# Patient Record
Sex: Female | Born: 1965
Health system: Southern US, Community
[De-identification: ages and names within clinical notes are randomized; demographics above are authoritative.]

## PROBLEM LIST (undated history)

## (undated) DIAGNOSIS — L309 Dermatitis, unspecified: Secondary | ICD-10-CM

## (undated) DIAGNOSIS — R51 Headache: Secondary | ICD-10-CM

## (undated) HISTORY — PX: DILATION AND CURETTAGE OF UTERUS: SHX78

## (undated) HISTORY — DX: Dermatitis, unspecified: L30.9

## (undated) HISTORY — PX: OTHER SURGICAL HISTORY: SHX169

## (undated) HISTORY — DX: Headache: R51

---

## 2006-06-21 LAB — CONVERTED CEMR LAB: Pap Smear: NORMAL

## 2009-07-01 ENCOUNTER — Ambulatory Visit: Payer: Self-pay | Admitting: Family Medicine

## 2009-07-01 DIAGNOSIS — E663 Overweight: Secondary | ICD-10-CM | POA: Insufficient documentation

## 2009-07-01 DIAGNOSIS — R519 Headache, unspecified: Secondary | ICD-10-CM | POA: Insufficient documentation

## 2009-07-01 DIAGNOSIS — R5381 Other malaise: Secondary | ICD-10-CM | POA: Insufficient documentation

## 2009-07-01 DIAGNOSIS — R5383 Other fatigue: Secondary | ICD-10-CM

## 2009-07-01 DIAGNOSIS — R51 Headache: Secondary | ICD-10-CM | POA: Insufficient documentation

## 2009-07-02 LAB — CONVERTED CEMR LAB
Direct LDL: 134.9 mg/dL
Eosinophils Relative: 1.3 % (ref 0.0–5.0)
HCT: 39.4 % (ref 36.0–46.0)
HDL: 65.1 mg/dL (ref >39.00)
Hemoglobin: 13.4 g/dL (ref 12.0–15.0)
Lymphs Abs: 2.1 10*3/uL (ref 0.7–4.0)
MCV: 93.7 fL (ref 78.0–100.0)
Monocytes Absolute: 0.6 10*3/uL (ref 0.1–1.0)
Neutro Abs: 5.2 10*3/uL (ref 1.4–7.7)
Platelets: 311 10*3/uL (ref 150.0–400.0)
RDW: 12.7 % (ref 11.5–14.6)
Triglycerides: 44 mg/dL (ref 0.0–149.0)
WBC: 8 10*3/uL (ref 4.5–10.5)

## 2009-07-09 ENCOUNTER — Other Ambulatory Visit: Admission: RE | Admit: 2009-07-09 | Discharge: 2009-07-09 | Payer: Self-pay | Admitting: Family Medicine

## 2009-07-09 ENCOUNTER — Ambulatory Visit: Payer: Self-pay | Admitting: Family Medicine

## 2009-07-09 ENCOUNTER — Encounter: Payer: Self-pay | Admitting: Family Medicine

## 2009-07-15 ENCOUNTER — Encounter (INDEPENDENT_AMBULATORY_CARE_PROVIDER_SITE_OTHER): Payer: Self-pay | Admitting: *Deleted

## 2009-07-23 ENCOUNTER — Encounter: Payer: Self-pay | Admitting: Family Medicine

## 2009-08-02 ENCOUNTER — Encounter: Payer: Self-pay | Admitting: Family Medicine

## 2009-08-04 ENCOUNTER — Encounter: Payer: Self-pay | Admitting: Family Medicine

## 2009-08-04 DIAGNOSIS — N63 Unspecified lump in unspecified breast: Secondary | ICD-10-CM | POA: Insufficient documentation

## 2010-01-14 ENCOUNTER — Encounter: Payer: Self-pay | Admitting: Family Medicine

## 2011-06-09 ENCOUNTER — Other Ambulatory Visit: Payer: Self-pay | Admitting: Family Medicine

## 2011-06-09 DIAGNOSIS — Z131 Encounter for screening for diabetes mellitus: Secondary | ICD-10-CM

## 2011-06-09 DIAGNOSIS — R5381 Other malaise: Secondary | ICD-10-CM

## 2011-06-09 DIAGNOSIS — Z13220 Encounter for screening for lipoid disorders: Secondary | ICD-10-CM

## 2011-06-16 ENCOUNTER — Other Ambulatory Visit (INDEPENDENT_AMBULATORY_CARE_PROVIDER_SITE_OTHER): Payer: 59

## 2011-06-16 DIAGNOSIS — R5383 Other fatigue: Secondary | ICD-10-CM

## 2011-06-16 DIAGNOSIS — Z131 Encounter for screening for diabetes mellitus: Secondary | ICD-10-CM

## 2011-06-16 DIAGNOSIS — R5381 Other malaise: Secondary | ICD-10-CM

## 2011-06-16 DIAGNOSIS — Z13220 Encounter for screening for lipoid disorders: Secondary | ICD-10-CM

## 2011-06-16 LAB — CBC WITH DIFFERENTIAL/PLATELET
Basophils Relative: 0.5 % (ref 0.0–3.0)
Eosinophils Relative: 1.6 % (ref 0.0–5.0)
HCT: 40 % (ref 36.0–46.0)
Hemoglobin: 13.4 g/dL (ref 12.0–15.0)
Lymphs Abs: 2.3 10*3/uL (ref 0.7–4.0)
MCV: 93.7 fl (ref 78.0–100.0)
Monocytes Absolute: 0.6 10*3/uL (ref 0.1–1.0)
Neutro Abs: 4.4 10*3/uL (ref 1.4–7.7)
RBC: 4.27 Mil/uL (ref 3.87–5.11)
WBC: 7.4 10*3/uL (ref 4.5–10.5)

## 2011-06-16 LAB — COMPREHENSIVE METABOLIC PANEL
ALT: 15 U/L (ref 0–35)
AST: 18 U/L (ref 0–37)
Alkaline Phosphatase: 38 U/L — ABNORMAL LOW (ref 39–117)
BUN: 14 mg/dL (ref 6–23)
Creatinine, Ser: 0.7 mg/dL (ref 0.4–1.2)

## 2011-06-16 LAB — LIPID PANEL
Cholesterol: 219 mg/dL — ABNORMAL HIGH (ref 0–200)
HDL: 79 mg/dL (ref >39.00)
Total CHOL/HDL Ratio: 3
VLDL: 10 mg/dL (ref 0.0–40.0)

## 2011-06-21 ENCOUNTER — Encounter: Payer: Self-pay | Admitting: Family Medicine

## 2011-06-21 LAB — HM MAMMOGRAPHY

## 2011-06-22 ENCOUNTER — Ambulatory Visit (INDEPENDENT_AMBULATORY_CARE_PROVIDER_SITE_OTHER): Payer: 59 | Admitting: Family Medicine

## 2011-06-22 ENCOUNTER — Encounter: Payer: Self-pay | Admitting: Family Medicine

## 2011-06-22 ENCOUNTER — Other Ambulatory Visit (HOSPITAL_COMMUNITY)
Admission: RE | Admit: 2011-06-22 | Discharge: 2011-06-22 | Disposition: A | Discharge status: Home / Self Care | Payer: 59 | Source: Ambulatory Visit | Attending: Family Medicine | Admitting: Family Medicine

## 2011-06-22 VITALS — BP 120/80 | HR 55 | Temp 98.3°F | Ht 68.0 in | Wt 181.5 lb

## 2011-06-22 DIAGNOSIS — Z01419 Encounter for gynecological examination (general) (routine) without abnormal findings: Secondary | ICD-10-CM | POA: Insufficient documentation

## 2011-06-22 DIAGNOSIS — Z23 Encounter for immunization: Secondary | ICD-10-CM

## 2011-06-22 DIAGNOSIS — R7309 Other abnormal glucose: Secondary | ICD-10-CM

## 2011-06-22 DIAGNOSIS — Z0000 Encounter for general adult medical examination without abnormal findings: Secondary | ICD-10-CM

## 2011-06-22 DIAGNOSIS — E663 Overweight: Secondary | ICD-10-CM

## 2011-06-22 DIAGNOSIS — Z1159 Encounter for screening for other viral diseases: Secondary | ICD-10-CM | POA: Insufficient documentation

## 2011-06-22 DIAGNOSIS — R739 Hyperglycemia, unspecified: Secondary | ICD-10-CM | POA: Insufficient documentation

## 2011-06-22 MED ORDER — RIZATRIPTAN BENZOATE 5 MG PO TABS: 5.0000 mg | ORAL_TABLET | ORAL | Status: DC | PRN

## 2011-06-22 MED ORDER — PHENTERMINE HCL 30 MG PO TBDP: 1.0000 | ORAL_TABLET | ORAL | Status: DC

## 2011-06-22 MED ORDER — RIZATRIPTAN BENZOATE 10 MG PO TABS: 10.0000 mg | ORAL_TABLET | Freq: Once | ORAL | Status: DC | PRN

## 2011-06-22 NOTE — Progress Notes (Signed)
Addended by: Dianne Dun on: 06/22/2011 03:34 PM   Modules accepted: Orders

## 2011-06-22 NOTE — Progress Notes (Signed)
Subjective:    Patient ID: Kelsey Harrington, female    DOB: 05-12-1966, 45 y.o.   MRN: [id number]  HPI 45 yo here for CPX   1. Borderline Hyperlipidemia.   LDL 140, HDL 79, TG 50. Trying to loose weight, cannot seem to get the weight off. Walking everyday.  Cutting out sugar. Snacks late at night, after work.  2.  Hyperglycemia- fasting CBG 105 today.  Denies any increased thirst or urination. No FH of DM.  Patient Active Problem List  Diagnoses  . OVERWEIGHT  . BREAST MASS, RIGHT  . FATIGUE  . HEADACHE  . Routine general medical examination at a health care facility  . Hyperglycemia   Past Medical History  Diagnosis Date  . Headache    No past surgical history on file. History  Substance Use Topics  . Smoking status: Never Smoker   . Smokeless tobacco: Not on file  . Alcohol Use: Yes   Family History  Problem Relation Age of Onset  . Arthritis Mother   . Hypertension Mother   . Hypertension Maternal Grandmother    No Known Allergies Current Outpatient Prescriptions on File Prior to Visit  Medication Sig Dispense Refill  . b complex vitamins tablet Take 1 tablet by mouth daily.        . Multiple Vitamin (MULTIVITAMIN) tablet Take 1 tablet by mouth daily.           Review of Systems See HPI Patient reports no  vision/ hearing changes,anorexia, weight change, fever ,adenopathy, persistant / recurrent hoarseness, swallowing issues, chest pain, edema,persistant / recurrent cough, hemoptysis, dyspnea(rest, exertional, paroxysmal nocturnal), gastrointestinal  bleeding (melena, rectal bleeding), abdominal pain, excessive heart burn, GU symptoms(dysuria, hematuria, pyuria, voiding/incontinence  Issues) syncope, focal weakness, severe memory loss, concerning skin lesions, depression, anxiety, abnormal bruising/bleeding, major joint swelling, breast masses or abnormal vaginal bleeding.       Objective:   Physical Exam BP 120/80  Pulse 55  Temp(Src) 98.3 F (36.8 C) (Oral)   Ht 5\' 8"  (1.727 m)  Wt 181 lb 8 oz (82.328 kg)  BMI 27.60 kg/m2  LMP 05/29/2011  General:  Well-developed,well-nourished,in no acute distress; alert,appropriate and cooperative throughout examination Head:  normocephalic and atraumatic.   Eyes:  vision grossly intact, pupils equal, pupils round, and pupils reactive to light.   Ears:  R ear normal and L ear normal.   Nose:  no external deformity.   Mouth:  good dentition.   Neck:  No deformities, masses, or tenderness noted. Breasts:  No mass, nodules, thickening, tenderness, bulging, retraction, inflamation, nipple discharge or skin changes noted.   Lungs:  Normal respiratory effort, chest expands symmetrically. Lungs are clear to auscultation, no crackles or wheezes. Heart:  Normal rate and regular rhythm. S1 and S2 normal without gallop, murmur, click, rub or other extra sounds. Abdomen:  Bowel sounds positive,abdomen soft and non-tender without masses, organomegaly or hernias noted. Rectal:  no external abnormalities.   Genitalia:  Pelvic Exam:        External: normal female genitalia without lesions or masses        Vagina: normal without lesions or masses        Cervix: normal without lesions or masses        Adnexa: normal bimanual exam without masses or fullness        Uterus: normal by palpation        Pap smear: performed Msk:  No deformity or scoliosis noted of thoracic or lumbar spine.  Extremities:  No clubbing, cyanosis, edema, or deformity noted with normal full range of motion of all joints.   Neurologic:  alert & oriented X3 and gait normal.   Skin:  Intact without suspicious lesions or rashes Cervical Nodes:  No lymphadenopathy noted Axillary Nodes:  No palpable lymphadenopathy Psych:  Cognition and judgment appear intact. Alert and cooperative with normal attention span and concentration. No apparent delusions, illusions, hallucinations    Assessment & Plan:   1. Routine general medical examination at a health  care facility Reviewed preventive care protocols, scheduled due services, and updated immunizations Discussed nutrition, exercise, diet, and healthy lifestyle.    Cytology -Pap Smear  2. Overweight  Deteriorated.   Discussed risk and benefits of phentermine.  Pt would like to try it. See avs for details.    3. Hyperglycemia   New.  Discussed eat right diet, handout given.

## 2011-06-22 NOTE — Patient Instructions (Signed)
Good to see you. Please follow up with me in one month.  IMPORTANT: HOW TO USE THIS INFORMATION:  This is a summary and does NOT have all possible information about this product. This information does not assure that this product is safe, effective, or appropriate for you. This information is not individual medical advice and does not substitute for the advice of your health care professional. Always ask your health care professional for complete information about this product and your specific health needs.    PHENTERMINE - ORAL (FEN-ter-meen)    COMMON BRAND NAME(S): Adipex-P, Ionamin, Pro-Fast    USES:  Phentermine is used along with a doctor-approved, reduced-calorie diet, exercise, and behavior change program to help you lose weight. It is used in people who are significantly overweight (obese) and have not been able to lose enough weight with diet and exercise alone. Losing weight and keeping it off can reduce the many health risks that come with obesity, including heart disease, diabetes, high blood pressure, and a shorter life. It is not known how this medication helps people to lose weight. It may work by decreasing your appetite, increasing the amount of energy used by your body, or by affecting certain parts of the brain. This medication is an appetite suppressant and belongs to a class of drugs called sympathomimetic amines.    HOW TO USE:  Take this medication by mouth, usually once a day 1 hour before breakfast or 1-2 hours after breakfast or as directed by your doctor. The tablet form may be taken at a lower dose (8 milligrams) up to 3 times a day 30 minutes before meals. Taking this medication late in the day may cause trouble sleeping (insomnia). If you are using sustained-release capsules, swallow the medication whole. Do not crush or chew the sustained-release capsules. Doing so can destroy the long action of the drug and may increase side effects. The dosage is based on your medical  condition and response to therapy. Your doctor will adjust the dose to find the best dose for you. Use this medication regularly and exactly as prescribed in order to get the most benefit from it. To help you remember, take it at the same time(s) each day. This medication is usually taken for only a few weeks at a time. It should not be taken with other appetite suppressants (see also Drug Interactions section). The possibility of serious side effects increases with longer use of this medication and use of this drug along with certain other diet drugs. This medication may cause withdrawal reactions, especially if it has been used regularly for a long time or in high doses. In such cases, withdrawal symptoms (such as depression, severe tiredness) may occur if you suddenly stop using this medication. To prevent withdrawal reactions, your doctor may reduce your dose gradually. Consult your doctor or pharmacist for more details, and report any withdrawal reactions immediately. Rarely, abnormal drug-seeking behavior (addiction) is possible with this medication. Do not increase your dose, take it more frequently, or use it for a longer time than prescribed. Properly stop the medication when so directed. This medication may stop working well after you have been taking it for a few weeks. Talk with your doctor if this medication stops working well. Do not increase the dose unless directed by your doctor. Your doctor may direct you to stop taking this medication.    SIDE EFFECTS:  Dizziness, dry mouth, difficulty sleeping, irritability, nausea, vomiting, diarrhea, or constipation may occur. If these effects  persist or worsen, notify your doctor or pharmacist promptly. Remember that your doctor has prescribed this medication because he or she has judged that the benefit to you is greater than the risk of side effects. Many people using this medication do not have serious side effects. Tell your doctor immediately if any of  these unlikely but serious side effects occur: fast/irregular/pounding heartbeat, mental/mood changes (e.g., agitation, uncontrolled anger, hallucinations, nervousness), uncontrolled muscle movements, change in sexual ability/interest. Stop taking this medication and seek immediate medical attention if any of these rare but very serious side effects occur: severe headache, slurred speech, seizure, weakness on one side of the body, vision changes (e.g., blurred vision). This drug may infrequently cause serious (sometimes fatal) lung or heart problems (pulmonary hypertension, heart valve problems). The risk increases with longer use of this medication and use of this drug along with other appetite-suppressant drugs/herbal products. If you notice any of the following unlikely but very serious side effects, stop taking this medication and consult your doctor or pharmacist immediately: chest pain, difficulty breathing with exercise, decreased ability to exercise, fainting, swelling of the legs/ankles/feet. A very serious allergic reaction to this drug is rare. However, seek immediate medical attention if you notice any of the following symptoms of a serious allergic reaction: rash, itching/swelling (especially of the face/tongue/throat), severe dizziness, trouble breathing. This is not a complete list of possible side effects. If you notice other effects not listed above, contact your doctor or pharmacist. In the [place] - Call your doctor for medical advice about side effects. You may report side effects to FDA at 1-800-FDA-1088. In [place] - Call your doctor for medical advice about side effects. You may report side effects to [facility] [place] at [phone].    PRECAUTIONS:  Before taking this medication, tell your doctor or pharmacist if you are allergic to it; or to any other sympathomimetic amines (e.g., decongestants such as pseudoephedrine, stimulants such as amphetamine, appetite suppressants such as  diethylpropion); or if you have any other allergies. This product may contain inactive ingredients, which can cause allergic reactions or other problems. Talk to your pharmacist for more details. This medication should not be used if you have certain medical conditions. Before using this medicine, consult your doctor or pharmacist if you have: uncontrolled high blood pressure, glaucoma, history of alcohol/drug abuse, vascular heart disease (e.g., chest pain, heart attack), mental/mood problems (e.g., severe anxiety, bipolar disorder, psychosis, schizophrenia), high blood pressure in the lungs (pulmonary hypertension), stroke, overactive thyroid (hyperthyroidism). Before using this medication, tell your doctor or pharmacist your medical history, especially of: diabetes, controlled high blood pressure, other heart problems (e.g., heart murmur, fast/irregular heartbeat, heart valve problems), kidney disease, seizure problem. This drug may make you dizzy or (rarely) drowsy or cause blurred vision. Do not drive, use machinery, or do any activity that requires alertness or clear vision until you are sure you can perform such activities safely. Avoid alcoholic beverages. Before having surgery, tell your doctor or dentist that you are using this medication. Kidney function declines as you grow older. This medication is removed by the kidneys. Therefore, elderly people may be at greater risk for dizziness and high blood pressure while using this drug. During pregnancy, this medication should be used only when clearly needed. It is not recommended for use for long periods or in high doses near the expected delivery date because of possible harm to the unborn baby. Discuss the risks and benefits with your doctor. Infants born to mothers who have  been using this medication for a long time or in high doses may have withdrawal symptoms such as irritability or extreme tiredness. Tell your doctor immediately if you notice any of  these symptoms in your newborn. This drug may pass into breast milk and could have undesirable effects on a nursing infant. Therefore, breast-feeding is not recommended while using this drug. Consult your doctor before breast-feeding.    DRUG INTERACTIONS:  Your doctor or pharmacist may already be aware of any possible drug interactions and may be monitoring you for them. Do not start, stop, or change the dosage of any medicine before checking with them first. This drug should not be used with certain medications because very serious interactions may occur. If you are taking or have taken other appetite-suppressant drugs in the past year (e.g., diethylpropion, sibutramine, ephedra/ma huang), tell your doctor or pharmacist before starting this medication. Avoid taking MAO inhibitors (e.g., furazolidone, isocarboxazid, linezolid, moclobemide, phenelzine, procarbazine, rasagiline, selegiline, tranylcypromine) within 2 weeks before, during, and after treatment with this medication. In some cases a serious (possibly fatal) drug interaction may occur. If you are currently using any of these medications, tell your doctor or pharmacist before starting this medication. Before using this medication, tell your doctor or pharmacist of all prescription and nonprescription/herbal products you may use, especially: drugs for depression (e.g., TCAs such as imipramine, SSRIs and SNRIs such as paroxetine, fluoxetine, venlafaxine, duloxetine), drugs for diabetes (e.g., insulin, sulfonylureas such as glipizide), high blood pressure medicine (e.g., guanethidine, methyldopa), phenothiazines (e.g., prochlorperazine, promethazine, chlorpromazine), other stimulants (e.g., amphetamines, methylphenidate, street drugs such as cocaine or MDMA/"ecstasy"). Tell your doctor or pharmacist if you also take drugs that cause dizziness or drowsiness such as: certain antihistamines (e.g., diphenhydramine), anti-seizure drugs (e.g., carbamazepine),  medicine for sleep or anxiety (e.g., alprazolam, diazepam, zolpidem), muscle relaxants, narcotic pain relievers (e.g., codeine), psychiatric medicines (e.g., risperidone, amitriptyline, trazodone). Also report the use of drugs which might increase seizure risk when combined with this medication such as isoniazid (INH), phenothiazines (e.g., thioridazine), theophylline, or tricyclic antidepressants (e.g., amitriptyline), among others. Check the labels on all your medicines/herbal products (e.g., cough-and-cold products containing decongestants such as pseudoephedrine, diet aids such as phenylpropanolamine, ephedra/ma huang) because they may contain ingredients that could increase your heart rate or blood pressure. Ask your pharmacist about using those products safely. Caffeine can increase the side effects of this medication. Avoid drinking large amounts of beverages containing caffeine (coffee, tea, colas) or eating large amounts of chocolate. This document does not contain all possible interactions. Therefore, before using this product, tell your doctor or pharmacist of all the products you use. Keep a list of all your medications with you, and share the list with your doctor and pharmacist.    OVERDOSE:  If overdose is suspected, contact your poison control center or emergency room immediately. [place] residents can call the [place] National Poison Hotline at 938-205-3921. [place] residents can call a Technical sales engineer center. Symptoms of overdose may include: rapid breathing, unusual restlessness, fast/slow/irregular heartbeat, chest pain, hallucinations, seizures, loss of consciousness.    NOTES:  Appetite suppressants should not be used in place of proper diet. For best results, this drug must be used along with a doctor-approved diet and exercise program. Do not share this medication with others. It is against the law. Laboratory and/or medical tests (e.g., blood pressure, heart tests, kidney tests) may be  performed periodically to monitor your progress or check for side effects. Consult your doctor for more details.  MISSED DOSE:  If you miss a dose, take it as soon as you remember. If it is near the time of the next dose or late in the evening, skip the missed dose and resume your usual dosing schedule. Do not double the dose to catch up.    STORAGE:  Store in a tightly closed container at room temperature between 68-77 degrees F (20-25 degrees C) away from light and moisture. Keep all medications away from children and pets. Do not flush medications down the toilet or pour them into a drain unless instructed to do so. Properly discard this product when it is expired or no longer needed. Consult your pharmacist or local waste disposal company for more details about how to safely discard your product.    Information last revised May 2010 Copyright(c) 2010 [employer].

## 2011-06-23 ENCOUNTER — Telehealth: Payer: Self-pay | Admitting: *Deleted

## 2011-06-23 NOTE — Telephone Encounter (Signed)
Yes definitely ok to change to capsules.

## 2011-06-23 NOTE — Telephone Encounter (Signed)
Called [facility] and left message on voicemail advising ok to change from tablets to capsules.

## 2011-06-23 NOTE — Telephone Encounter (Signed)
Tonya from [facility] called and asked to change phentermine script that was sent in yesterday from disintegrating tablets to capsules.  She says the pt wants the capsules, the other tablets cost 3 times more.  Advised ok to change.

## 2011-06-28 ENCOUNTER — Encounter: Payer: Self-pay | Admitting: *Deleted

## 2011-07-21 ENCOUNTER — Ambulatory Visit (INDEPENDENT_AMBULATORY_CARE_PROVIDER_SITE_OTHER): Payer: 59 | Admitting: Family Medicine

## 2011-07-21 ENCOUNTER — Encounter: Payer: Self-pay | Admitting: Family Medicine

## 2011-07-21 VITALS — BP 120/82 | HR 68 | Temp 98.3°F | Ht 68.0 in | Wt 170.8 lb

## 2011-07-21 DIAGNOSIS — E663 Overweight: Secondary | ICD-10-CM

## 2011-07-21 DIAGNOSIS — M25579 Pain in unspecified ankle and joints of unspecified foot: Secondary | ICD-10-CM

## 2011-07-21 DIAGNOSIS — M25571 Pain in right ankle and joints of right foot: Secondary | ICD-10-CM

## 2011-07-21 MED ORDER — PHENTERMINE HCL 30 MG PO CAPS: 30.0000 mg | ORAL_CAPSULE | ORAL | Status: DC

## 2011-07-21 NOTE — Progress Notes (Signed)
Subjective:    Patient ID: Kelsey Harrington, female    DOB: Apr 04, 1966, 45 y.o.   MRN: [id number]  HPI 45 yo here for one month follow obesity.  Started phentermine last month 45 mg daily last month. Doing very well.  Taking it mid day, it has been helping with her late evening craving. No problems sleeping now, had a little trouble when she first started taking it. No noticeable side effects and BP has been great.  Wt Readings from Last 3 Encounters:  07/21/11 170 lb 12 oz (77.452 kg)  06/22/11 181 lb 8 oz (82.328 kg)  07/09/09 178 lb (80.74 kg)   Right ankle pain- still bothering her. Stepped off a curb in July and inverted her ankle. Has had pain and swelling since.  Swelling has improved but still comes and goes. Still has pain with running and wearing heels.     Patient Active Problem List  Diagnoses  . OVERWEIGHT  . BREAST MASS, RIGHT  . FATIGUE  . HEADACHE  . Routine general medical examination at a health care facility  . Hyperglycemia   Past Medical History  Diagnosis Date  . Headache    No past surgical history on file. History  Substance Use Topics  . Smoking status: Never Smoker   . Smokeless tobacco: Not on file  . Alcohol Use: Yes   Family History  Problem Relation Age of Onset  . Arthritis Mother   . Hypertension Mother   . Hypertension Maternal Grandmother    No Known Allergies Current Outpatient Prescriptions on File Prior to Visit  Medication Sig Dispense Refill  . b complex vitamins tablet Take 1 tablet by mouth daily.        . Multiple Vitamin (MULTIVITAMIN) tablet Take 1 tablet by mouth daily.        . Phentermine HCl 30 MG TBDP Take 1 tablet by mouth 1 day or 1 dose.  30 tablet  0  . rizatriptan (MAXALT) 5 MG tablet Take 1 tablet (5 mg total) by mouth as needed for migraine. May repeat in 2 hours if needed  10 tablet  0     Review of Systems See HPI No CP, SOB, HA or blurred vision.     Objective:   Physical Exam BP 120/82  Pulse 68   Temp(Src) 98.3 F (36.8 C) (Oral)  Ht 5\' 8"  (1.727 m)  Wt 170 lb 12 oz (77.452 kg)  BMI 25.96 kg/m2  LMP 07/19/2011  General:  Well-developed,well-nourished,in no acute distress; alert,appropriate and cooperative throughout examination Head:  normocephalic and atraumatic.   Eyes:  vision grossly intact, pupils equal, pupils round, and pupils reactive to light.   Ears:  R ear normal and L ear normal.   Nose:  no external deformity.   Mouth:  good dentition.   Neck:  No deformities, masses, or tenderness noted. Lungs:  Normal respiratory effort, chest expands symmetrically. Lungs are clear to auscultation, no crackles or wheezes. Heart:  Normal rate and regular rhythm. S1 and S2 normal without gallop, murmur, click, rub or other extra sounds. Abdomen:  Bowel sounds positive,abdomen soft and non-tender without masses, organomegaly or hernias noted. Msk:  No deformity or scoliosis noted of thoracic or lumbar spine.   Extremities:  Right ankle:  + edema,  FROM, tender to active eversion.   Neurologic:  alert & oriented X3 and gait normal.   Skin:  Intact without suspicious lesions or rashes Psych:  Cognition and judgment appear intact. Alert and cooperative  with normal attention span and concentration. No apparent delusions, illusions, hallucinations    Assessment & Plan:   1. Overweight   Improved and normotensive with phentermine. Will refill and follow up in one month.   2. Right ankle pain   Unchanged.  ?Ligamentaous strain or tear at this point. Will refer to ortho for further work up and tx. The patient indicates understanding of these issues and agrees with the plan.  Ambulatory referral to Orthopedic Surgery

## 2011-07-21 NOTE — Patient Instructions (Signed)
Great to see you. Please stop by to see Kelsey Harrington on your way out to set up your orthopedist referral.

## 2011-08-02 ENCOUNTER — Encounter: Payer: Self-pay | Admitting: Family Medicine

## 2011-08-02 ENCOUNTER — Encounter: Payer: Self-pay | Admitting: *Deleted

## 2011-08-18 ENCOUNTER — Encounter: Payer: Self-pay | Admitting: Family Medicine

## 2011-08-18 ENCOUNTER — Ambulatory Visit (INDEPENDENT_AMBULATORY_CARE_PROVIDER_SITE_OTHER): Payer: 59 | Admitting: Family Medicine

## 2011-08-18 VITALS — BP 104/72 | HR 77 | Temp 98.1°F | Ht 68.0 in | Wt 164.5 lb

## 2011-08-18 DIAGNOSIS — E669 Obesity, unspecified: Secondary | ICD-10-CM

## 2011-08-18 MED ORDER — PHENTERMINE HCL 37.5 MG PO CAPS: 30.0000 mg | ORAL_CAPSULE | ORAL | Status: DC

## 2011-08-18 NOTE — Progress Notes (Signed)
  Subjective:    Patient ID: Kelsey Harrington, female    DOB: May 08, 1966, 45 y.o.   MRN: [id number]  HPI 45 yo here for one month follow obesity.  Started phentermine last month 30 mg daily in September. Doing very well.  Taking it mid day, it has been helping with her late evening craving. No problems sleeping now at all. Would like to increase dose if possible. No noticeable side effects and BP has been great.  Wt Readings from Last 3 Encounters:  08/18/11 164 lb 8 oz (74.617 kg)  07/21/11 170 lb 12 oz (77.452 kg)  06/22/11 181 lb 8 oz (82.328 kg)        Patient Active Problem List  Diagnoses  . OVERWEIGHT  . BREAST MASS, RIGHT  . FATIGUE  . HEADACHE  . Routine general medical examination at a health care facility  . Hyperglycemia  . Right ankle pain   Past Medical History  Diagnosis Date  . Headache    No past surgical history on file. History  Substance Use Topics  . Smoking status: Never Smoker   . Smokeless tobacco: Not on file  . Alcohol Use: Yes   Family History  Problem Relation Age of Onset  . Arthritis Mother   . Hypertension Mother   . Hypertension Maternal Grandmother    No Known Allergies Current Outpatient Prescriptions on File Prior to Visit  Medication Sig Dispense Refill  . b complex vitamins tablet Take 1 tablet by mouth daily.        . Multiple Vitamin (MULTIVITAMIN) tablet Take 1 tablet by mouth daily.        . Omega-3 Fatty Acids (FISH OIL) 1000 MG CAPS Take 1 capsule by mouth daily.        . phentermine 30 MG capsule Take 1 capsule (30 mg total) by mouth every morning.  30 capsule  0     Review of Systems See HPI No CP, SOB, HA or blurred vision.     Objective:   Physical Exam LMP 07/19/2011 BP 104/72  Pulse 77  Temp(Src) 98.1 F (36.7 C) (Oral)  Ht 5\' 8"  (1.727 m)  Wt 164 lb 8 oz (74.617 kg)  BMI 25.01 kg/m2  LMP 08/11/2011  General:  Well-developed,well-nourished,in no acute distress; alert,appropriate and cooperative  throughout examination Head:  normocephalic and atraumatic.   Eyes:  vision grossly intact, pupils equal, pupils round, and pupils reactive to light.   Ears:  R ear normal and L ear normal.   Nose:  no external deformity.   Mouth:  good dentition.   Neck:  No deformities, masses, or tenderness noted. Lungs:  Normal respiratory effort, chest expands symmetrically. Lungs are clear to auscultation, no crackles or wheezes. Heart:  Normal rate and regular rhythm. S1 and S2 normal without gallop, murmur, click, rub or other extra sounds. Abdomen:  Bowel sounds positive,abdomen soft and non-tender without masses, organomegaly or hernias noted. Msk:  No deformity or scoliosis noted of thoracic or lumbar spine.   Neurologic:  alert & oriented X3 and gait normal.   Skin:  Intact without suspicious lesions or rashes Psych:  Cognition and judgment appear intact. Alert and cooperative with normal attention span and concentration. No apparent delusions, illusions, hallucinations    Assessment & Plan:   1. Overweight   Improved and normotensive with phentermine. Will increase dose to 37.5 mg d follow up in one month.

## 2011-08-18 NOTE — Patient Instructions (Signed)
Great to see you and congratulations on your weight loss. Please come back to see me in one month.

## 2011-09-28 ENCOUNTER — Encounter: Payer: Self-pay | Admitting: Family Medicine

## 2011-09-28 ENCOUNTER — Ambulatory Visit (INDEPENDENT_AMBULATORY_CARE_PROVIDER_SITE_OTHER): Payer: 59 | Admitting: Family Medicine

## 2011-09-28 VITALS — BP 120/80 | HR 60 | Temp 97.6°F | Ht 68.0 in | Wt 156.8 lb

## 2011-09-28 DIAGNOSIS — E663 Overweight: Secondary | ICD-10-CM

## 2011-09-28 MED ORDER — PHENTERMINE HCL 37.5 MG PO CAPS: 30.0000 mg | ORAL_CAPSULE | ORAL | Status: DC

## 2011-09-28 NOTE — Progress Notes (Signed)
  Subjective:    Patient ID: Kelsey Harrington, female    DOB: 07/22/1966, 45 y.o.   MRN: [id number]  HPI 45 yo here for one month follow obesity.  Started phentermine 30 mg daily in September.  Increased dose to 37.5 mg daily last month.  Doing very well.  Taking it mid day, it has been helping with her late evening craving. No problems sleeping now at all. Would like to increase dose if possible. No noticeable side effects and BP has been great. Her personal goal weight is 148.  Wt Readings from Last 3 Encounters:  09/28/11 156 lb 12 oz (71.101 kg)  08/18/11 164 lb 8 oz (74.617 kg)  07/21/11 170 lb 12 oz (77.452 kg)         Patient Active Problem List  Diagnoses  . OVERWEIGHT  . BREAST MASS, RIGHT  . FATIGUE  . HEADACHE  . Routine general medical examination at a health care facility  . Hyperglycemia  . Right ankle pain   Past Medical History  Diagnosis Date  . Headache    No past surgical history on file. History  Substance Use Topics  . Smoking status: Never Smoker   . Smokeless tobacco: Not on file  . Alcohol Use: Yes   Family History  Problem Relation Age of Onset  . Arthritis Mother   . Hypertension Mother   . Hypertension Maternal Grandmother    No Known Allergies Current Outpatient Prescriptions on File Prior to Visit  Medication Sig Dispense Refill  . b complex vitamins tablet Take 1 tablet by mouth daily.        . Multiple Vitamin (MULTIVITAMIN) tablet Take 1 tablet by mouth daily.        . Omega-3 Fatty Acids (FISH OIL) 1000 MG CAPS Take 1 capsule by mouth daily.           Review of Systems See HPI No CP, SOB, HA or blurred vision.     Objective:   Physical Exam BP 120/80  Pulse 60  Temp(Src) 97.6 F (36.4 C) (Oral)  Ht 5\' 8"  (1.727 m)  Wt 156 lb 12 oz (71.101 kg)  BMI 23.83 kg/m2  LMP 09/08/2011  General:  Well-developed,well-nourished,in no acute distress; alert,appropriate and cooperative throughout examination Head:  normocephalic  and atraumatic.   Eyes:  vision grossly intact, pupils equal, pupils round, and pupils reactive to light.   Ears:  R ear normal and L ear normal.   Nose:  no external deformity.   Mouth:  good dentition.   Neck:  No deformities, masses, or tenderness noted. Lungs:  Normal respiratory effort, chest expands symmetrically. Lungs are clear to auscultation, no crackles or wheezes. Heart:  Normal rate and regular rhythm. S1 and S2 normal without gallop, murmur, click, rub or other extra sounds. Abdomen:  Bowel sounds positive,abdomen soft and non-tender without masses, organomegaly or hernias noted. Msk:  No deformity or scoliosis noted of thoracic or lumbar spine.   Neurologic:  alert & oriented X3 and gait normal.   Skin:  Intact without suspicious lesions or rashes Psych:  Cognition and judgment appear intact. Alert and cooperative with normal attention span and concentration. No apparent delusions, illusions, hallucinations    Assessment & Plan:   1. Overweight   Improved and normotensive with phentermine. BMI 23.8. Can continue phentermine for one more month. Rx given. The patient indicates understanding of these issues and agrees with the plan.

## 2012-07-25 ENCOUNTER — Encounter: Payer: Self-pay | Admitting: Family Medicine

## 2012-07-25 ENCOUNTER — Ambulatory Visit (INDEPENDENT_AMBULATORY_CARE_PROVIDER_SITE_OTHER): Payer: BC Managed Care – PPO | Admitting: Family Medicine

## 2012-07-25 VITALS — BP 118/82 | HR 68 | Temp 97.5°F | Ht 67.25 in | Wt 171.0 lb

## 2012-07-25 DIAGNOSIS — Z136 Encounter for screening for cardiovascular disorders: Secondary | ICD-10-CM

## 2012-07-25 DIAGNOSIS — R739 Hyperglycemia, unspecified: Secondary | ICD-10-CM

## 2012-07-25 DIAGNOSIS — R7309 Other abnormal glucose: Secondary | ICD-10-CM

## 2012-07-25 DIAGNOSIS — E663 Overweight: Secondary | ICD-10-CM

## 2012-07-25 DIAGNOSIS — Z0000 Encounter for general adult medical examination without abnormal findings: Secondary | ICD-10-CM

## 2012-07-25 DIAGNOSIS — Z1231 Encounter for screening mammogram for malignant neoplasm of breast: Secondary | ICD-10-CM

## 2012-07-25 DIAGNOSIS — Z23 Encounter for immunization: Secondary | ICD-10-CM

## 2012-07-25 LAB — COMPREHENSIVE METABOLIC PANEL
AST: 18 U/L (ref 0–37)
Albumin: 3.9 g/dL (ref 3.5–5.2)
BUN: 12 mg/dL (ref 6–23)
CO2: 30 mEq/L (ref 19–32)
Calcium: 9.9 mg/dL (ref 8.4–10.5)
Chloride: 107 mEq/L (ref 96–112)
GFR: 85.65 mL/min (ref >60.00)
Glucose, Bld: 94 mg/dL (ref 70–99)
Potassium: 4.5 mEq/L (ref 3.5–5.1)

## 2012-07-25 LAB — CBC WITH DIFFERENTIAL/PLATELET
Basophils Absolute: 0 10*3/uL (ref 0.0–0.1)
Eosinophils Relative: 1.7 % (ref 0.0–5.0)
HCT: 38 % (ref 36.0–46.0)
Hemoglobin: 12.8 g/dL (ref 12.0–15.0)
Lymphs Abs: 2.1 10*3/uL (ref 0.7–4.0)
MCV: 93.1 fl (ref 78.0–100.0)
Monocytes Relative: 8.9 % (ref 3.0–12.0)
Neutro Abs: 3.7 10*3/uL (ref 1.4–7.7)
RDW: 13.2 % (ref 11.5–14.6)

## 2012-07-25 LAB — LIPID PANEL
Cholesterol: 190 mg/dL (ref 0–200)
Total CHOL/HDL Ratio: 3
Triglycerides: 40 mg/dL (ref 0.0–149.0)

## 2012-07-25 MED ORDER — PHENTERMINE HCL 30 MG PO CAPS: 30.0000 mg | ORAL_CAPSULE | ORAL | Status: DC

## 2012-07-25 NOTE — Patient Instructions (Addendum)
Good to see you. We will call you with your lab results. Please stop by to see Shirlee Limerick on your way out to set up your mammogram.  Please come see me in one month to follow up phentermine.

## 2012-07-25 NOTE — Progress Notes (Signed)
Subjective:    Patient ID: Kelsey Harrington, female    DOB: 1966/06/20, 46 y.o.   MRN: [id number]  HPI 46 yo here for CPX.  Doing well.  Just got a promotion at work that she has been working towards for four years!  Last pap smear was done here in 06/2011- normal.  Denies any dysuria, vaginal discharge or other GU complaints.   1. Borderline Hyperlipidemia.   Lab Results  Component Value Date   CHOL 219* 06/16/2011   HDL 79.00 06/16/2011   LDLCALC 136 07/01/2009   LDLDIRECT 140.0 06/16/2011   TRIG 50.0 06/16/2011   CHOLHDL 3 06/16/2011     2.  Hyperglycemia-h/o hyperglycemia last year.  Due for labs this year.  3.  Weight gain- did really well with phentermine last year.  She is a stress eater and has been snacking more lately. Never had any CP, SOB or palpitations while she was taking phentermine. She is interested in restarting it.  Patient Active Problem List  Diagnosis  . OVERWEIGHT  . BREAST MASS, RIGHT  . FATIGUE  . HEADACHE  . Routine general medical examination at a health care facility  . Hyperglycemia  . Right ankle pain   Past Medical History  Diagnosis Date  . Headache    No past surgical history on file. History  Substance Use Topics  . Smoking status: Never Smoker   . Smokeless tobacco: Not on file  . Alcohol Use: Yes   Family History  Problem Relation Age of Onset  . Arthritis Mother   . Hypertension Mother   . Hypertension Maternal Grandmother    No Known Allergies Current Outpatient Prescriptions on File Prior to Visit  Medication Sig Dispense Refill  . b complex vitamins tablet Take 1 tablet by mouth daily.        . Multiple Vitamin (MULTIVITAMIN) tablet Take 1 tablet by mouth daily.        . Omega-3 Fatty Acids (FISH OIL) 1000 MG CAPS Take 1 capsule by mouth daily.           Review of Systems See HPI Patient reports no  vision/ hearing changes,anorexia, weight change, fever ,adenopathy, persistant / recurrent hoarseness, swallowing issues, chest  pain, edema,persistant / recurrent cough, hemoptysis, dyspnea(rest, exertional, paroxysmal nocturnal), gastrointestinal  bleeding (melena, rectal bleeding), abdominal pain, excessive heart burn, GU symptoms(dysuria, hematuria, pyuria, voiding/incontinence  Issues) syncope, focal weakness, severe memory loss, concerning skin lesions, depression, anxiety, abnormal bruising/bleeding, major joint swelling, breast masses or abnormal vaginal bleeding.       Objective:   Physical Exam BP 118/82  Pulse 68  Temp 97.5 F (36.4 C)  Ht 5' 7.25" (1.708 m)  Wt 171 lb (77.565 kg)  BMI 26.58 kg/m2 Wt Readings from Last 3 Encounters:  07/25/12 171 lb (77.565 kg)  09/28/11 156 lb 12 oz (71.101 kg)  08/18/11 164 lb 8 oz (74.617 kg)    General:  Well-developed,well-nourished,in no acute distress; alert,appropriate and cooperative throughout examination Head:  normocephalic and atraumatic.   Eyes:  vision grossly intact, pupils equal, pupils round, and pupils reactive to light.   Ears:  R ear normal and L ear normal.   Nose:  no external deformity.   Mouth:  good dentition.   Neck:  No deformities, masses, or tenderness noted. Breasts:  No mass, nodules, thickening, tenderness, bulging, retraction, inflamation, nipple discharge or skin changes noted.   Lungs:  Normal respiratory effort, chest expands symmetrically. Lungs are clear to auscultation, no crackles or  wheezes. Heart:  Normal rate and regular rhythm. S1 and S2 normal without gallop, murmur, click, rub or other extra sounds. Msk:  No deformity or scoliosis noted of thoracic or lumbar spine.   Extremities:  No clubbing, cyanosis, edema, or deformity noted with normal full range of motion of all joints.   Neurologic:  alert & oriented X3 and gait normal.   Skin:  Intact without suspicious lesions or rashes Psych:  Cognition and judgment appear intact. Alert and cooperative with normal attention span and concentration. No apparent delusions,  illusions, hallucinations    Assessment & Plan:   1. Routine general medical examination at a health care facility  Reviewed preventive care protocols, scheduled due services, and updated immunizations Discussed nutrition, exercise, diet, and healthy lifestyle.  Comprehensive metabolic panel, CBC with Differential  2. Hyperglycemia  Recheck fasting CBG today.   3. Screening for ischemic heart disease  Lipid Panel  4. Other screening mammogram  MM Digital Screening  5. Overweight  Deteriorated. Tolerated phentermine well.  Discussed risks again of phentermine.  Pt aware and would like to try it again- rx given for 30 mg capsules. She will follow up in one month. The patient indicates understanding of these issues and agrees with the plan.

## 2012-07-25 NOTE — Addendum Note (Signed)
Addended by: Eliezer Bottom on: 07/25/2012 01:01 PM   Modules accepted: Orders

## 2012-07-29 ENCOUNTER — Encounter: Payer: Self-pay | Admitting: *Deleted

## 2012-08-01 ENCOUNTER — Encounter: Payer: Self-pay | Admitting: Family Medicine

## 2012-08-01 ENCOUNTER — Encounter: Payer: Self-pay | Admitting: *Deleted

## 2012-12-19 ENCOUNTER — Ambulatory Visit (INDEPENDENT_AMBULATORY_CARE_PROVIDER_SITE_OTHER): Payer: 59 | Admitting: Family Medicine

## 2012-12-19 ENCOUNTER — Encounter: Payer: Self-pay | Admitting: Family Medicine

## 2012-12-19 VITALS — BP 120/80 | HR 71 | Temp 98.1°F | Wt 179.0 lb

## 2012-12-19 DIAGNOSIS — H1013 Acute atopic conjunctivitis, bilateral: Secondary | ICD-10-CM

## 2012-12-19 DIAGNOSIS — H1045 Other chronic allergic conjunctivitis: Secondary | ICD-10-CM

## 2012-12-19 MED ORDER — BEPOTASTINE BESILATE 1.5 % OP SOLN: 1.0000 [drp] | Freq: Two times a day (BID) | OPHTHALMIC | Status: DC

## 2012-12-19 NOTE — Patient Instructions (Addendum)

## 2012-12-19 NOTE — Progress Notes (Signed)
SUBJECTIVE:  47 y.o. female with burning, redness, discharge and mattering in both eyes for 3 days. This has happened on and off for past 6 weeks.  Also having itchy rash that comes and goes on her neck.  Does not have it now but had it this morning.   .  No significant prior ophthalmological history. No change in visual acuity, no photophobia, no severe eye pain.  Patient Active Problem List  Diagnosis  . OVERWEIGHT  . BREAST MASS, RIGHT  . FATIGUE  . HEADACHE  . Routine general medical examination at a health care facility  . Hyperglycemia  . Right ankle pain   Past Medical History  Diagnosis Date  . Headache    No past surgical history on file. History  Substance Use Topics  . Smoking status: Never Smoker   . Smokeless tobacco: Never Used  . Alcohol Use: Yes   Family History  Problem Relation Age of Onset  . Arthritis Mother   . Hypertension Mother   . Hypertension Maternal Grandmother    No Known Allergies Current Outpatient Prescriptions on File Prior to Visit  Medication Sig Dispense Refill  . Omega-3 Fatty Acids (FISH OIL) 1000 MG CAPS Take 1 capsule by mouth daily.        . phentermine 30 MG capsule Take 1 capsule (30 mg total) by mouth every morning.  30 capsule  0   No current facility-administered medications on file prior to visit.   The PMH, PSH, Social History, Family History, Medications, and allergies have been reviewed in [facility], and have been updated if relevant.  OBJECTIVE:  BP 120/80  Pulse 71  Temp(Src) 98.1 F (36.7 C) (Oral)  Wt 179 lb (81.194 kg)  BMI 27.83 kg/m2  SpO2 99%  Patient appears well, vitals signs are normal. Eyes: both eyes with erythema, no discharge.  PERRLA, no foreign body noted. No periorbital cellulitis. The corneas are clear and fundi normal. Visual acuity normal.   ASSESSMENT:  Conjunctivitis - probably allergic.  PLAN:  Bepreve drops per order. Add Zyrtec po daily.   She has thrown away her current eye makeup.  Did  change body wash- she has stopped this.  If no improvement by Monday, she will follow up with her eye doctor.   The patient indicates understanding of these issues and agrees with the plan.

## 2013-01-21 ENCOUNTER — Encounter: Payer: Self-pay | Admitting: Family Medicine

## 2013-01-21 ENCOUNTER — Ambulatory Visit (INDEPENDENT_AMBULATORY_CARE_PROVIDER_SITE_OTHER): Payer: 59 | Admitting: Family Medicine

## 2013-01-21 VITALS — BP 140/90 | HR 76 | Temp 98.0°F | Wt 178.0 lb

## 2013-01-21 DIAGNOSIS — E663 Overweight: Secondary | ICD-10-CM

## 2013-01-21 MED ORDER — PHENTERMINE HCL 30 MG PO CAPS: 30.0000 mg | ORAL_CAPSULE | ORAL | Status: DC

## 2013-01-21 NOTE — Patient Instructions (Addendum)
Good to see you. We are restarting the phentermine 30 mg daily. Follow up in one month.

## 2013-01-21 NOTE — Progress Notes (Signed)
  Subjective:    Patient ID: Kelsey Harrington, female    DOB: [birth year], 47 y.o.   MRN: [id number]  HPI 47 yo here for one month follow obesity.  In 2012, she did have some success with Phentermine.    Had no elevated BP, palpitations or other noticeable side effects. She also had more energy and motivation to exercise.  She would like to restart it. Wt Readings from Last 3 Encounters:  01/21/13 178 lb (80.74 kg)  12/19/12 179 lb (81.194 kg)  07/25/12 171 lb (77.565 kg)   BMI now 27.6.      Patient Active Problem List  Diagnosis  . OVERWEIGHT  . BREAST MASS, RIGHT  . FATIGUE  . HEADACHE  . Hyperglycemia   Past Medical History  Diagnosis Date  . Headache    No past surgical history on file. History  Substance Use Topics  . Smoking status: Never Smoker   . Smokeless tobacco: Never Used  . Alcohol Use: Yes   Family History  Problem Relation Age of Onset  . Arthritis Mother   . Hypertension Mother   . Hypertension Maternal Grandmother    No Known Allergies Current Outpatient Prescriptions on File Prior to Visit  Medication Sig Dispense Refill  . Bepotastine Besilate 1.5 % SOLN Place 1 drop into both eyes 2 (two) times daily.  1 Bottle  0  . Omega-3 Fatty Acids (FISH OIL) 1000 MG CAPS Take 1 capsule by mouth daily.        . phentermine 30 MG capsule Take 1 capsule (30 mg total) by mouth every morning.  30 capsule  0   No current facility-administered medications on file prior to visit.     Review of Systems See HPI No CP, SOB, HA or blurred vision.     Objective:   Physical Exam Wt Readings from Last 3 Encounters:  01/21/13 178 lb (80.74 kg)  12/19/12 179 lb (81.194 kg)  07/25/12 171 lb (77.565 kg)   BP Readings from Last 3 Encounters:  01/21/13 140/90  12/19/12 120/80  07/25/12 118/82   General:  Well-developed,well-nourished,in no acute distress; alert,appropriate and cooperative throughout examination Head:  normocephalic and atraumatic.   Eyes:   vision grossly intact, pupils equal, pupils round, and pupils reactive to light.   Ears:  R ear normal and L ear normal.   Nose:  no external deformity.   Mouth:  good dentition.   Neck:  No deformities, masses, or tenderness noted. Lungs:  Normal respiratory effort, chest expands symmetrically. Lungs are clear to auscultation, no crackles or wheezes. Heart:  Normal rate and regular rhythm. S1 and S2 normal without gallop, murmur, click, rub or other extra sounds. Abdomen:  Bowel sounds positive,abdomen soft and non-tender without masses, organomegaly or hernias noted. Msk:  No deformity or scoliosis noted of thoracic or lumbar spine.   Neurologic:  alert & oriented X3 and gait normal.   Skin:  Intact without suspicious lesions or rashes Psych:  Cognition and judgment appear intact. Alert and cooperative with normal attention span and concentration. No apparent delusions, illusions, hallucinations    Assessment & Plan:   1. Overweight Deteriorated.   She is aware of risks of phentermine and would like to restart it. Rx given for phentermine 30 mg daily. Follow up in one month.

## 2013-02-27 ENCOUNTER — Ambulatory Visit (INDEPENDENT_AMBULATORY_CARE_PROVIDER_SITE_OTHER): Payer: 59 | Admitting: Family Medicine

## 2013-02-27 ENCOUNTER — Encounter: Payer: Self-pay | Admitting: Family Medicine

## 2013-02-27 VITALS — BP 128/80 | HR 76 | Temp 98.3°F | Wt 169.0 lb

## 2013-02-27 DIAGNOSIS — E663 Overweight: Secondary | ICD-10-CM

## 2013-02-27 MED ORDER — PHENTERMINE HCL 30 MG PO CAPS: 30.0000 mg | ORAL_CAPSULE | ORAL | Status: DC

## 2013-02-27 NOTE — Progress Notes (Signed)
  Subjective:    Patient ID: Kelsey Harrington, female    DOB: [birth year], 47 y.o.   MRN: [id number]  HPI 47 yo here for one month follow obesity.  Restarted phentermine last month.    Has had no elevated BP, palpitations or other noticeable side effects. She also had more energy and motivation to exercise.  Wt Readings from Last 3 Encounters:  02/27/13 169 lb (76.658 kg)  01/21/13 178 lb (80.74 kg)  12/19/12 179 lb (81.194 kg)     Patient Active Problem List   Diagnosis Date Noted  . Hyperglycemia 06/22/2011  . BREAST MASS, RIGHT 08/04/2009  . OVERWEIGHT 07/01/2009  . FATIGUE 07/01/2009  . HEADACHE 07/01/2009   Past Medical History  Diagnosis Date  . Headache    No past surgical history on file. History  Substance Use Topics  . Smoking status: Never Smoker   . Smokeless tobacco: Never Used  . Alcohol Use: Yes   Family History  Problem Relation Age of Onset  . Arthritis Mother   . Hypertension Mother   . Hypertension Maternal Grandmother    No Known Allergies Current Outpatient Prescriptions on File Prior to Visit  Medication Sig Dispense Refill  . Omega-3 Fatty Acids (FISH OIL) 1000 MG CAPS Take 1 capsule by mouth daily.        . phentermine 30 MG capsule Take 1 capsule (30 mg total) by mouth every morning.  30 capsule  0   No current facility-administered medications on file prior to visit.     Review of Systems See HPI No CP, SOB, HA or blurred vision.     Objective:   Physical Exam BP 128/80  Pulse 76  Temp(Src) 98.3 F (36.8 C)  Wt 169 lb (76.658 kg)  BMI 26.28 kg/m2  Wt Readings from Last 3 Encounters:  02/27/13 169 lb (76.658 kg)  01/21/13 178 lb (80.74 kg)  12/19/12 179 lb (81.194 kg)   BP Readings from Last 3 Encounters:  02/27/13 128/80  01/21/13 140/90  12/19/12 120/80   General:  Well-developed,well-nourished,in no acute distress; alert,appropriate and cooperative throughout examination Head:  normocephalic and atraumatic.   Eyes:   vision grossly intact, pupils equal, pupils round, and pupils reactive to light.   Ears:  R ear normal and L ear normal.   Nose:  no external deformity.   Mouth:  good dentition.   Neck:  No deformities, masses, or tenderness noted. Lungs:  Normal respiratory effort, chest expands symmetrically. Lungs are clear to auscultation, no crackles or wheezes. Heart:  Normal rate and regular rhythm. S1 and S2 normal without gallop, murmur, click, rub or other extra sounds. Abdomen:  Bowel sounds positive,abdomen soft and non-tender without masses, organomegaly or hernias noted. Msk:  No deformity or scoliosis noted of thoracic or lumbar spine.   Neurologic:  alert & oriented X3 and gait normal.   Skin:  Intact without suspicious lesions or rashes Psych:  Cognition and judgment appear intact. Alert and cooperative with normal attention span and concentration. No apparent delusions, illusions, hallucinations    Assessment & Plan:   1. Overweight Improved with 10 pound weight loss! Continue phentermine at current dose. Follow up in one month.

## 2013-02-27 NOTE — Patient Instructions (Addendum)
Good to see you. We are continuing the phentermine 30 mg daily. Follow up in one month.

## 2013-04-14 ENCOUNTER — Ambulatory Visit (INDEPENDENT_AMBULATORY_CARE_PROVIDER_SITE_OTHER): Payer: 59 | Admitting: Family Medicine

## 2013-04-14 ENCOUNTER — Encounter: Payer: Self-pay | Admitting: Family Medicine

## 2013-04-14 VITALS — BP 130/80 | HR 64 | Temp 98.0°F | Wt 170.0 lb

## 2013-04-14 DIAGNOSIS — E663 Overweight: Secondary | ICD-10-CM

## 2013-04-14 MED ORDER — PHENTERMINE HCL 37.5 MG PO CAPS: 37.5000 mg | ORAL_CAPSULE | ORAL | Status: DC

## 2013-04-14 NOTE — Progress Notes (Signed)
  Subjective:    Patient ID: Kelsey Harrington, female    DOB: [birth year], 47 y.o.   MRN: [id number]  HPI 47 yo here for one month follow obesity.  Restarted phentermine two months ago.   Has had no elevated BP, palpitations or other noticeable side effects. She also had more energy and motivation to exercise.  Weight stable- has lost 10 pounds since restarting it.  Wt Readings from Last 3 Encounters:  04/14/13 170 lb (77.111 kg)  02/27/13 169 lb (76.658 kg)  01/21/13 178 lb (80.74 kg)     Patient Active Problem List   Diagnosis Date Noted  . Hyperglycemia 06/22/2011  . BREAST MASS, RIGHT 08/04/2009  . OVERWEIGHT 07/01/2009  . FATIGUE 07/01/2009  . Kelsey Harrington 07/01/2009   Past Medical History  Diagnosis Date  . Kelsey Harrington(784.0)    No past surgical history on file. History  Substance Use Topics  . Smoking status: Never Smoker   . Smokeless tobacco: Never Used  . Alcohol Use: Yes   Family History  Problem Relation Age of Onset  . Arthritis Kelsey Harrington   . Kelsey Harrington   . Kelsey Harrington    No Known Allergies Current Outpatient Prescriptions on File Prior to Visit  Medication Sig Dispense Refill  . Multiple Vitamin (MULTIVITAMIN) tablet Take 1 tablet by mouth daily.      . Omega-3 Fatty Acids (FISH OIL) 1000 MG CAPS Take 1 capsule by mouth daily.        . phentermine 30 MG capsule Take 1 capsule (30 mg total) by mouth every morning.  30 capsule  0   No current facility-administered medications on file prior to visit.     Review of Systems See HPI No CP, SOB, HA or blurred vision.     Objective:   Physical Exam BP 130/80  Pulse 64  Temp(Src) 98 F (36.7 C)  Wt 170 lb (77.111 kg)  BMI 26.43 kg/m2   Wt Readings from Last 3 Encounters:  04/14/13 170 lb (77.111 kg)  02/27/13 169 lb (76.658 kg)  01/21/13 178 lb (80.74 kg)    General:  Well-developed,well-nourished,in no acute distress; alert,appropriate and cooperative throughout  examination Head:  normocephalic and atraumatic.   Eyes:  vision grossly intact, pupils equal, pupils round, and pupils reactive to light.   Ears:  R ear normal and L ear normal.   Nose:  no external deformity.   Mouth:  good dentition.   Neck:  No deformities, masses, or tenderness noted. Lungs:  Normal respiratory effort, chest expands symmetrically. Lungs are clear to auscultation, no crackles or wheezes. Heart:  Normal rate and regular rhythm. S1 and S2 normal without gallop, murmur, click, rub or other extra sounds. Abdomen:  Bowel sounds positive,abdomen soft and non-tender without masses, organomegaly or hernias noted. Msk:  No deformity or scoliosis noted of thoracic or lumbar spine.   Neurologic:  alert & oriented X3 and gait normal.   Skin:  Intact without suspicious lesions or rashes Psych:  Cognition and judgment appear intact. Alert and cooperative with normal attention span and concentration. No apparent delusions, illusions, hallucinations    Assessment & Plan:   1. Overweight   Weight is stable.  She feels good.  VSS and asymptomatic Increase phentermine to 37.5 mg daily. Follow up in 2 months.

## 2013-04-14 NOTE — Patient Instructions (Addendum)
Good to see you. We are increasing phentermine 37.5 mg daily. Follow up in two months.

## 2013-05-15 ENCOUNTER — Other Ambulatory Visit: Payer: Self-pay

## 2013-05-15 NOTE — Telephone Encounter (Signed)
Pt request refill phentermine to [facility]. Pt said Dr Dayton Martes said when need refill call and she would refill.Please advise.

## 2013-05-16 MED ORDER — PHENTERMINE HCL 37.5 MG PO CAPS: 37.5000 mg | ORAL_CAPSULE | ORAL | Status: DC

## 2013-05-16 NOTE — Telephone Encounter (Signed)
plz phone in. 

## 2013-05-16 NOTE — Telephone Encounter (Signed)
[facility] to [facility]. 

## 2013-07-22 ENCOUNTER — Encounter: Payer: 59 | Admitting: Family Medicine

## 2013-07-29 ENCOUNTER — Encounter: Payer: 59 | Admitting: Family Medicine

## 2013-10-27 ENCOUNTER — Encounter: Payer: 59 | Admitting: Family Medicine

## 2013-10-28 ENCOUNTER — Ambulatory Visit (INDEPENDENT_AMBULATORY_CARE_PROVIDER_SITE_OTHER): Payer: 59 | Admitting: Family Medicine

## 2013-10-28 ENCOUNTER — Encounter: Payer: Self-pay | Admitting: Family Medicine

## 2013-10-28 VITALS — BP 106/62 | HR 61 | Temp 97.9°F | Ht 66.5 in | Wt 177.5 lb

## 2013-10-28 DIAGNOSIS — Z0000 Encounter for general adult medical examination without abnormal findings: Secondary | ICD-10-CM

## 2013-10-28 DIAGNOSIS — E663 Overweight: Secondary | ICD-10-CM

## 2013-10-28 DIAGNOSIS — Z136 Encounter for screening for cardiovascular disorders: Secondary | ICD-10-CM

## 2013-10-28 DIAGNOSIS — Z1231 Encounter for screening mammogram for malignant neoplasm of breast: Secondary | ICD-10-CM

## 2013-10-28 MED ORDER — PHENTERMINE HCL 30 MG PO CAPS: 30.0000 mg | ORAL_CAPSULE | ORAL | Status: DC

## 2013-10-28 NOTE — Progress Notes (Signed)
Pre-visit discussion using our clinic review tool. No additional management support is needed unless otherwise documented below in the visit note.  

## 2013-10-28 NOTE — Progress Notes (Signed)
Subjective:    Patient ID: Kelsey Harrington, female    DOB: [birth year], 48 y.o.   MRN: [id number]  HPI 48 yo here for CPX.  Doing well.  Just got a promotion at work that she has been working towards for four years!  Last pap smear was done here in 06/2011- normal.  Denies any dysuria, vaginal discharge or other GU complaints.  On her period today and would like to come back for her pap smear.  Due for mammogram.   1. Borderline Hyperlipidemia.  Due for labs. Lab Results  Component Value Date   CHOL 190 07/25/2012   HDL 67.70 07/25/2012   LDLCALC 114* 07/25/2012   LDLDIRECT 140.0 06/16/2011   TRIG 40.0 07/25/2012   CHOLHDL 3 07/25/2012     2.  Hyperglycemia-h/o hyperglycemia.   Due for labs this year.  3.  Weight gain- did really well with phentermine last year.  She is a stress eater and has been snacking more lately with her new job promotion. Never had any CP, SOB or palpitations while she was taking phentermine. She is interested in restarting it.  Patient Active Problem List   Diagnosis Date Noted  . Routine general medical examination at a health care facility 10/28/2013  . Hyperglycemia 06/22/2011  . BREAST MASS, RIGHT 08/04/2009  . OVERWEIGHT 07/01/2009  . FATIGUE 07/01/2009  . HEADACHE 07/01/2009   Past Medical History  Diagnosis Date  . Headache(784.0)    No past surgical history on file. History  Substance Use Topics  . Smoking status: Never Smoker   . Smokeless tobacco: Never Used  . Alcohol Use: Yes   Family History  Problem Relation Age of Onset  . Arthritis Mother   . Hypertension Mother   . Hypertension Maternal Grandmother    No Known Allergies Current Outpatient Prescriptions on File Prior to Visit  Medication Sig Dispense Refill  . Multiple Vitamin (MULTIVITAMIN) tablet Take 1 tablet by mouth daily.      . Omega-3 Fatty Acids (FISH OIL) 1000 MG CAPS Take 1 capsule by mouth daily.        . phentermine 37.5 MG capsule Take 1 capsule (37.5 mg  total) by mouth every morning.  30 capsule  0   No current facility-administered medications on file prior to visit.     Review of Systems See HPI Patient reports no  vision/ hearing changes,anorexia, weight change, fever ,adenopathy, persistant / recurrent hoarseness, swallowing issues, chest pain, edema,persistant / recurrent cough, hemoptysis, dyspnea(rest, exertional, paroxysmal nocturnal), gastrointestinal  bleeding (melena, rectal bleeding), abdominal pain, excessive heart burn, GU symptoms(dysuria, hematuria, pyuria, voiding/incontinence  Issues) syncope, focal weakness, severe memory loss, concerning skin lesions, depression, anxiety, abnormal bruising/bleeding, major joint swelling, breast masses or abnormal vaginal bleeding.       Objective:   Physical Exam BP 106/62  Pulse 61  Temp(Src) 97.9 F (36.6 C) (Oral)  Ht 5' 6.5" (1.689 m)  Wt 177 lb 8 oz (80.513 kg)  BMI 28.22 kg/m2  SpO2 97%  LMP 10/26/2013 Wt Readings from Last 3 Encounters:  10/28/13 177 lb 8 oz (80.513 kg)  04/14/13 170 lb (77.111 kg)  02/27/13 169 lb (76.658 kg)    General:  Well-developed,well-nourished,in no acute distress; alert,appropriate and cooperative throughout examination Head:  normocephalic and atraumatic.   Eyes:  vision grossly intact, pupils equal, pupils round, and pupils reactive to light.   Ears:  R ear normal and L ear normal.   Nose:  no external deformity.   Mouth:  good dentition.   Neck:  No deformities, masses, or tenderness noted. Breasts:  No mass, nodules, thickening, tenderness, bulging, retraction, inflamation, nipple discharge or skin changes noted.   Lungs:  Normal respiratory effort, chest expands symmetrically. Lungs are clear to auscultation, no crackles or wheezes. Heart:  Normal rate and regular rhythm. S1 and S2 normal without gallop, murmur, click, rub or other extra sounds. Msk:  No deformity or scoliosis noted of thoracic or lumbar spine.   Extremities:  No  clubbing, cyanosis, edema, or deformity noted with normal full range of motion of all joints.   Neurologic:  alert & oriented X3 and gait normal.   Skin:  Intact without suspicious lesions or rashes Psych:  Cognition and judgment appear intact. Alert and cooperative with normal attention span and concentration. No apparent delusions, illusions, hallucinations    Assessment & Plan:

## 2013-10-28 NOTE — Patient Instructions (Addendum)
Good to see you. Come back for labs on Thursday morning.  Come back for your pap smear at your convenience.  Come back in 1 month to recheck your blood pressure.  Please schedule your mammogram.

## 2013-10-28 NOTE — Assessment & Plan Note (Signed)
She is aware of risks of phentermine and would like to restart it. Rx given to pt.

## 2013-10-28 NOTE — Assessment & Plan Note (Signed)
Reviewed preventive care protocols, scheduled due services, and updated immunizations Discussed nutrition, exercise, diet, and healthy lifestyle.  

## 2013-10-30 ENCOUNTER — Encounter: Payer: Self-pay | Admitting: *Deleted

## 2013-10-30 ENCOUNTER — Other Ambulatory Visit (INDEPENDENT_AMBULATORY_CARE_PROVIDER_SITE_OTHER): Payer: 59

## 2013-10-30 DIAGNOSIS — Z136 Encounter for screening for cardiovascular disorders: Secondary | ICD-10-CM

## 2013-10-30 DIAGNOSIS — Z0000 Encounter for general adult medical examination without abnormal findings: Secondary | ICD-10-CM

## 2013-10-30 LAB — CBC WITH DIFFERENTIAL/PLATELET
BASOS PCT: 0.5 % (ref 0.0–3.0)
Basophils Absolute: 0 10*3/uL (ref 0.0–0.1)
EOS ABS: 0.2 10*3/uL (ref 0.0–0.7)
Eosinophils Relative: 2.8 % (ref 0.0–5.0)
HCT: 37.9 % (ref 36.0–46.0)
Hemoglobin: 12.9 g/dL (ref 12.0–15.0)
Lymphocytes Relative: 29.3 % (ref 12.0–46.0)
Lymphs Abs: 2 10*3/uL (ref 0.7–4.0)
MCHC: 34 g/dL (ref 30.0–36.0)
MCV: 90.8 fl (ref 78.0–100.0)
MONO ABS: 0.5 10*3/uL (ref 0.1–1.0)
Monocytes Relative: 7.1 % (ref 3.0–12.0)
Neutro Abs: 4 10*3/uL (ref 1.4–7.7)
Neutrophils Relative %: 60.3 % (ref 43.0–77.0)
PLATELETS: 372 10*3/uL (ref 150.0–400.0)
RBC: 4.18 Mil/uL (ref 3.87–5.11)
RDW: 13.8 % (ref 11.5–14.6)
WBC: 6.7 10*3/uL (ref 4.5–10.5)

## 2013-10-30 LAB — COMPREHENSIVE METABOLIC PANEL
ALK PHOS: 36 U/L — AB (ref 39–117)
ALT: 11 U/L (ref 0–35)
AST: 14 U/L (ref 0–37)
Albumin: 4.1 g/dL (ref 3.5–5.2)
BILIRUBIN TOTAL: 0.5 mg/dL (ref 0.3–1.2)
BUN: 14 mg/dL (ref 6–23)
CO2: 26 mEq/L (ref 19–32)
CREATININE: 0.9 mg/dL (ref 0.4–1.2)
Calcium: 9.1 mg/dL (ref 8.4–10.5)
Chloride: 107 mEq/L (ref 96–112)
GFR: 70.24 mL/min (ref >60.00)
GLUCOSE: 96 mg/dL (ref 70–99)
Potassium: 4.6 mEq/L (ref 3.5–5.1)
Sodium: 138 mEq/L (ref 135–145)
Total Protein: 7.3 g/dL (ref 6.0–8.3)

## 2013-10-30 LAB — LIPID PANEL
Cholesterol: 199 mg/dL (ref 0–200)
HDL: 61.1 mg/dL (ref >39.00)
LDL Cholesterol: 126 mg/dL — ABNORMAL HIGH (ref 0–99)
TRIGLYCERIDES: 61 mg/dL (ref 0.0–149.0)
Total CHOL/HDL Ratio: 3
VLDL: 12.2 mg/dL (ref 0.0–40.0)

## 2013-10-30 LAB — TSH: TSH: 0.64 u[IU]/mL (ref 0.35–5.50)

## 2013-11-03 ENCOUNTER — Other Ambulatory Visit (HOSPITAL_COMMUNITY)
Admission: RE | Admit: 2013-11-03 | Discharge: 2013-11-03 | Disposition: A | Discharge status: Home / Self Care | Payer: 59 | Source: Ambulatory Visit | Attending: Family Medicine | Admitting: Family Medicine

## 2013-11-03 ENCOUNTER — Ambulatory Visit (INDEPENDENT_AMBULATORY_CARE_PROVIDER_SITE_OTHER): Payer: 59 | Admitting: Family Medicine

## 2013-11-03 ENCOUNTER — Encounter: Payer: Self-pay | Admitting: Family Medicine

## 2013-11-03 VITALS — BP 122/76 | HR 76 | Temp 97.6°F | Wt 175.5 lb

## 2013-11-03 DIAGNOSIS — Z124 Encounter for screening for malignant neoplasm of cervix: Secondary | ICD-10-CM

## 2013-11-03 DIAGNOSIS — Z01419 Encounter for gynecological examination (general) (routine) without abnormal findings: Secondary | ICD-10-CM | POA: Insufficient documentation

## 2013-11-03 DIAGNOSIS — Z23 Encounter for immunization: Secondary | ICD-10-CM

## 2013-11-03 NOTE — Progress Notes (Signed)
Subjective:    Patient ID: Kelsey Harrington, female    DOB: 05/05/1966, 48 y.o.   MRN: [id number]  HPI  Very pleasant 48 yo G2P2 here for GYN exam.  Was here last week for CPX.  Denies any abnormal pap smears. Denies any vaginal complaints.  No dysuria.  Has not scheduled her mammogram.  Lab Results  Component Value Date   CHOL 199 10/30/2013   HDL 61.10 10/30/2013   LDLCALC 126* 10/30/2013   LDLDIRECT 140.0 06/16/2011   TRIG 61.0 10/30/2013   CHOLHDL 3 10/30/2013   Lab Results  Component Value Date   WBC 6.7 10/30/2013   HGB 12.9 10/30/2013   HCT 37.9 10/30/2013   MCV 90.8 10/30/2013   PLT 372.0 10/30/2013     Patient Active Problem List   Diagnosis Date Noted  . Encounter for routine gynecological examination 11/03/2013  . Routine general medical examination at a health care facility 10/28/2013  . Hyperglycemia 06/22/2011  . OVERWEIGHT 07/01/2009  . FATIGUE 07/01/2009   Past Medical History  Diagnosis Date  . Headache(784.0)    No past surgical history on file. History  Substance Use Topics  . Smoking status: Never Smoker   . Smokeless tobacco: Never Used  . Alcohol Use: Yes   Family History  Problem Relation Age of Onset  . Arthritis Mother   . Hypertension Mother   . Hypertension Maternal Grandmother    No Known Allergies Current Outpatient Prescriptions on File Prior to Visit  Medication Sig Dispense Refill  . Multiple Vitamin (MULTIVITAMIN) tablet Take 1 tablet by mouth daily.      . Omega-3 Fatty Acids (FISH OIL) 1000 MG CAPS Take 1 capsule by mouth daily.        . phentermine 30 MG capsule Take 1 capsule (30 mg total) by mouth every morning.  30 capsule  0  . phentermine 37.5 MG capsule Take 1 capsule (37.5 mg total) by mouth every morning.  30 capsule  0   No current facility-administered medications on file prior to visit.   The PMH, PSH, Social History, Family History, Medications, and allergies have been reviewed in [facility], and have been updated if  relevant.    Review of Systems See HPI No abnormal discharge or vaginal bleeding No dysuria    Objective:   Physical Exam BP 122/76  Pulse 76  Temp(Src) 97.6 F (36.4 C) (Oral)  Wt 175 lb 8 oz (79.606 kg)  SpO2 97%  LMP 10/26/2013  General:  Well-developed,well-nourished,in no acute distress; alert,appropriate and cooperative throughout examination Head:  normocephalic and atraumatic.   Eyes:  vision grossly intact, pupils equal, pupils round, and pupils reactive to light.   Ears:  R ear normal and L ear normal.   Nose:  no external deformity.   Mouth:  good dentition.   Neck:  No deformities, masses, or tenderness noted. Breasts:  No mass, nodules, thickening, tenderness, bulging, retraction, inflamation, nipple discharge or skin changes noted.   Rectal:  no external abnormalities.   Genitalia:  Pelvic Exam:        External: normal female genitalia without lesions or masses        Vagina: normal without lesions or masses        Cervix: normal without lesions or masses        Adnexa: normal bimanual exam without masses or fullness        Uterus: normal by palpation        Pap smear: performed Psych:  Cognition and judgment appear intact. Alert and cooperative with normal attention span and concentration. No apparent delusions, illusions, hallucinations         Assessment & Plan:

## 2013-11-03 NOTE — Assessment & Plan Note (Signed)
Pap smear today. She will call set up your mammogram.

## 2013-11-03 NOTE — Progress Notes (Signed)
Pre-visit discussion using our clinic review tool. No additional management support is needed unless otherwise documented below in the visit note.  

## 2013-11-05 ENCOUNTER — Encounter: Payer: Self-pay | Admitting: *Deleted

## 2013-11-07 ENCOUNTER — Encounter: Payer: Self-pay | Admitting: Family Medicine

## 2013-12-01 ENCOUNTER — Ambulatory Visit (INDEPENDENT_AMBULATORY_CARE_PROVIDER_SITE_OTHER): Payer: 59 | Admitting: Family Medicine

## 2013-12-01 ENCOUNTER — Encounter: Payer: Self-pay | Admitting: Family Medicine

## 2013-12-01 VITALS — BP 110/60 | HR 81 | Temp 97.6°F | Wt 167.0 lb

## 2013-12-01 DIAGNOSIS — E663 Overweight: Secondary | ICD-10-CM

## 2013-12-01 MED ORDER — PHENTERMINE HCL 30 MG PO CAPS: 30.0000 mg | ORAL_CAPSULE | ORAL | Status: DC

## 2013-12-01 NOTE — Progress Notes (Signed)
Pre visit review using our clinic review tool, if applicable. No additional management support is needed unless otherwise documented below in the visit note. 

## 2013-12-01 NOTE — Progress Notes (Signed)
  Subjective:    Patient ID: Kelsey Harrington, female    DOB: 1965/10/24, 48 y.o.   MRN: [id number]  HPI 48 yo pleasant female here for one month follow up.      Weight gain- did really well with phentermine last year.  She is a stress eater and had been snacking more lately with her new job promotion.  Never had any CP, SOB or palpitations while she was taking phentermine.    Restarted phentermine 30 mg daily last month.  Denies any side effects.  Making changes in her diet.  Not really exercising more yet but plans to. Patient Active Problem List   Diagnosis Date Noted  . Encounter for routine gynecological examination 11/03/2013  . Routine general medical examination at a health care facility 10/28/2013  . Hyperglycemia 06/22/2011  . OVERWEIGHT 07/01/2009  . FATIGUE 07/01/2009   Past Medical History  Diagnosis Date  . Headache(784.0)    No past surgical history on file. History  Substance Use Topics  . Smoking status: Never Smoker   . Smokeless tobacco: Never Used  . Alcohol Use: Yes   Family History  Problem Relation Age of Onset  . Arthritis Mother   . Hypertension Mother   . Hypertension Maternal Grandmother    No Known Allergies Current Outpatient Prescriptions on File Prior to Visit  Medication Sig Dispense Refill  . Multiple Vitamin (MULTIVITAMIN) tablet Take 1 tablet by mouth daily.      . Omega-3 Fatty Acids (FISH OIL) 1000 MG CAPS Take 1 capsule by mouth daily.        . phentermine 30 MG capsule Take 1 capsule (30 mg total) by mouth every morning.  30 capsule  0  . phentermine 37.5 MG capsule Take 1 capsule (37.5 mg total) by mouth every morning.  30 capsule  0   No current facility-administered medications on file prior to visit.     Review of Systems See HPI No CP No Palpitations No SOB     Objective:   Physical Exam BP 110/60  Pulse 81  Temp(Src) 97.6 F (36.4 C) (Oral)  Wt 167 lb (75.751 kg)  SpO2 99%  LMP 11/17/2013  Wt Readings from Last  3 Encounters:  11/17/13 167 lb (75.751 kg)  11/03/13 175 lb 8 oz (79.606 kg)  10/28/13 177 lb 8 oz (80.513 kg)      General:  Well-developed,well-nourished,in no acute distress; alert,appropriate and cooperative throughout examination Head:  normocephalic and atraumatic.   Lungs:  Normal respiratory effort, chest expands symmetrically. Lungs are clear to auscultation, no crackles or wheezes. Heart:  Normal rate and regular rhythm. S1 and S2 normal without gallop, murmur, click, rub or other extra sounds. Psych:  Cognition and judgment appear intact. Alert and cooperative with normal attention span and concentration. No apparent delusions, illusions, hallucinations    Assessment & Plan:

## 2013-12-01 NOTE — Patient Instructions (Signed)
Great to see you. You look great! Keep up the good work.

## 2013-12-01 NOTE — Assessment & Plan Note (Addendum)
Discussed weight loss plan. Doing well on current dose of phentermine.   Rx refilled.   Follow up in 1 month.  If BMI < 27 will decrease to half dose x 1 month then stop

## 2013-12-29 ENCOUNTER — Ambulatory Visit: Payer: 59 | Admitting: Family Medicine

## 2013-12-30 ENCOUNTER — Ambulatory Visit: Payer: 59 | Admitting: Family Medicine

## 2014-01-05 ENCOUNTER — Ambulatory Visit (INDEPENDENT_AMBULATORY_CARE_PROVIDER_SITE_OTHER): Payer: 59 | Admitting: Family Medicine

## 2014-01-05 ENCOUNTER — Encounter: Payer: Self-pay | Admitting: Family Medicine

## 2014-01-05 VITALS — BP 118/74 | HR 81 | Temp 97.5°F | Wt 161.2 lb

## 2014-01-05 DIAGNOSIS — E663 Overweight: Secondary | ICD-10-CM

## 2014-01-05 MED ORDER — PHENTERMINE HCL 37.5 MG PO CAPS: 37.5000 mg | ORAL_CAPSULE | ORAL | Status: DC

## 2014-01-05 NOTE — Progress Notes (Signed)
  Subjective:    Patient ID: Raiford Simmonds, female    DOB: 01-10-1966, 48 y.o.   MRN: [id number]  HPI 48 yo pleasant female here for one month follow up.     Weight gain-on phentermine 30 mg daily. Never had any CP, SOB or palpitations.  She has been moving more- they have a weight loss competition at work. She wears a fit bit.  Wt Readings from Last 3 Encounters:  01/05/14 161 lb 4 oz (73.143 kg)  11/17/13 167 lb (75.751 kg)  11/03/13 175 lb 8 oz (79.606 kg)     Patient Active Problem List   Diagnosis Date Noted  . Hyperglycemia 06/22/2011  . OVERWEIGHT 07/01/2009  . FATIGUE 07/01/2009   Past Medical History  Diagnosis Date  . Headache(784.0)    No past surgical history on file. History  Substance Use Topics  . Smoking status: Never Smoker   . Smokeless tobacco: Never Used  . Alcohol Use: Yes   Family History  Problem Relation Age of Onset  . Arthritis Mother   . Hypertension Mother   . Hypertension Maternal Grandmother    No Known Allergies Current Outpatient Prescriptions on File Prior to Visit  Medication Sig Dispense Refill  . phentermine 30 MG capsule Take 1 capsule (30 mg total) by mouth every morning.  30 capsule  0   No current facility-administered medications on file prior to visit.     Review of Systems See HPI No CP No Palpitations No SOB     Objective:   Physical Exam BP 118/74  Pulse 81  Temp(Src) 97.5 F (36.4 C) (Oral)  Wt 161 lb 4 oz (73.143 kg)  SpO2 98%  LMP 12/08/2013  Wt Readings from Last 3 Encounters:  01/05/14 161 lb 4 oz (73.143 kg)  11/17/13 167 lb (75.751 kg)  11/03/13 175 lb 8 oz (79.606 kg)      General:  Well-developed,well-nourished,in no acute distress; alert,appropriate and cooperative throughout examination Head:  normocephalic and atraumatic.   Lungs:  Normal respiratory effort, chest expands symmetrically. Lungs are clear to auscultation, no crackles or wheezes. Heart:  Normal rate and regular rhythm. S1  and S2 normal without gallop, murmur, click, rub or other extra sounds. Psych:  Cognition and judgment appear intact. Alert and cooperative with normal attention span and concentration. No apparent delusions, illusions, hallucinations    Assessment & Plan:

## 2014-01-05 NOTE — Progress Notes (Signed)
Pre visit review using our clinic review tool, if applicable. No additional management support is needed unless otherwise documented below in the visit note. 

## 2014-01-05 NOTE — Patient Instructions (Signed)
Great to see you. You look great. Keep moving!

## 2014-01-05 NOTE — Assessment & Plan Note (Signed)
Improving.  BMI now still above 25 but improving. Follow up in 1 month.  Will likely wean down dose at that time. The patient indicates understanding of these issues and agrees with the plan.

## 2014-02-05 ENCOUNTER — Ambulatory Visit: Payer: 59 | Admitting: Family Medicine

## 2014-02-10 ENCOUNTER — Ambulatory Visit (INDEPENDENT_AMBULATORY_CARE_PROVIDER_SITE_OTHER): Payer: 59 | Admitting: Family Medicine

## 2014-02-10 ENCOUNTER — Encounter: Payer: Self-pay | Admitting: Family Medicine

## 2014-02-10 VITALS — BP 104/64 | HR 66 | Temp 97.9°F | Ht 66.5 in | Wt 154.5 lb

## 2014-02-10 DIAGNOSIS — E663 Overweight: Secondary | ICD-10-CM

## 2014-02-10 MED ORDER — PHENTERMINE HCL 30 MG PO CAPS: 30.0000 mg | ORAL_CAPSULE | ORAL | Status: DC

## 2014-02-10 NOTE — Progress Notes (Signed)
Pre visit review using our clinic review tool, if applicable. No additional management support is needed unless otherwise documented below in the visit note. 

## 2014-02-10 NOTE — Assessment & Plan Note (Signed)
Doing well. BMI now 24.57. Will decrease dose to 30 mg daily. Follow up in 1 month.  If weight stable, will plan to d/c.

## 2014-02-10 NOTE — Progress Notes (Signed)
  Subjective:    Patient ID: Kelsey Harrington, female    DOB: 12-14-1965, 48 y.o.   MRN: [id number]  HPI 48 yo pleasant female here for one month follow up.     Weight gain-on phentermine 37.5 mg daily. Never had any CP, SOB or palpitations.  She has been exercising and eating better- they have a weight loss competition at work. She is now in second place!  Goal weight is "in the 140s."  Wt Readings from Last 3 Encounters:  02/10/14 154 lb 8 oz (70.081 kg)  01/05/14 161 lb 4 oz (73.143 kg)  11/17/13 167 lb (75.751 kg)     Patient Active Problem List   Diagnosis Date Noted  . Hyperglycemia 06/22/2011  . OVERWEIGHT 07/01/2009  . FATIGUE 07/01/2009   Past Medical History  Diagnosis Date  . Headache(784.0)    No past surgical history on file. History  Substance Use Topics  . Smoking status: Never Smoker   . Smokeless tobacco: Never Used  . Alcohol Use: Yes   Family History  Problem Relation Age of Onset  . Arthritis Mother   . Hypertension Mother   . Hypertension Maternal Grandmother    No Known Allergies No current outpatient prescriptions on file prior to visit.   No current facility-administered medications on file prior to visit.     Review of Systems See HPI No CP No Palpitations No SOB     Objective:   Physical Exam BP 104/64  Pulse 66  Temp(Src) 97.9 F (36.6 C) (Oral)  Ht 5' 6.5" (1.689 m)  Wt 154 lb 8 oz (70.081 kg)  BMI 24.57 kg/m2  LMP 02/02/2014  Wt Readings from Last 3 Encounters:  02/10/14 154 lb 8 oz (70.081 kg)  01/05/14 161 lb 4 oz (73.143 kg)  11/17/13 167 lb (75.751 kg)      General:  Well-developed,well-nourished,in no acute distress; alert,appropriate and cooperative throughout examination Head:  normocephalic and atraumatic.   Lungs:  Normal respiratory effort, chest expands symmetrically. Lungs are clear to auscultation, no crackles or wheezes. Heart:  Normal rate and regular rhythm. S1 and S2 normal without gallop, murmur,  click, rub or other extra sounds. Psych:  Cognition and judgment appear intact. Alert and cooperative with normal attention span and concentration. No apparent delusions, illusions, hallucinations    Assessment & Plan:

## 2014-02-10 NOTE — Patient Instructions (Signed)
Great to see you. Your BMI is now normal. Keep up the great work!

## 2014-03-13 ENCOUNTER — Encounter (INDEPENDENT_AMBULATORY_CARE_PROVIDER_SITE_OTHER): Payer: Self-pay

## 2014-03-13 ENCOUNTER — Encounter: Payer: Self-pay | Admitting: Family Medicine

## 2014-03-13 ENCOUNTER — Ambulatory Visit (INDEPENDENT_AMBULATORY_CARE_PROVIDER_SITE_OTHER): Payer: 59 | Admitting: Family Medicine

## 2014-03-13 VITALS — BP 120/74 | HR 76 | Temp 98.3°F | Wt 153.2 lb

## 2014-03-13 DIAGNOSIS — E663 Overweight: Secondary | ICD-10-CM

## 2014-03-13 MED ORDER — PHENTERMINE HCL 37.5 MG PO CAPS: 37.5000 mg | ORAL_CAPSULE | ORAL | Status: DC

## 2014-03-13 MED ORDER — PHENTERMINE HCL 30 MG PO CAPS: 30.0000 mg | ORAL_CAPSULE | ORAL | Status: DC

## 2014-03-13 NOTE — Progress Notes (Signed)
Pre visit review using our clinic review tool, if applicable. No additional management support is needed unless otherwise documented below in the visit note. 

## 2014-03-13 NOTE — Addendum Note (Signed)
Addended by: Lucille Passy on: 03/13/2014 01:14 PM   Modules accepted: Orders, Medications

## 2014-03-13 NOTE — Assessment & Plan Note (Signed)
Improving on phentermine. Follow up every other month since vitals have been stable. Rx given to pt. The patient indicates understanding of these issues and agrees with the plan.

## 2014-03-13 NOTE — Patient Instructions (Addendum)
Great to see you. You look great!  Follow up in 2 months instead of one month since you have been tolerating the medication well.

## 2014-03-13 NOTE — Progress Notes (Signed)
  Subjective:    Patient ID: Kelsey Harrington, female    DOB: Feb 21, 1966, 48 y.o.   MRN: [id number]  HPI 48 yo pleasant female here for one month follow up.     Weight gain-on phentermine 37.5 mg daily. Never had any CP, SOB or palpitations.  She has been exercising and eating better- they have a weight loss competition at work. She is not yet at her goal weight- admits to not doing as well this month. Wt Readings from Last 3 Encounters:  03/13/14 153 lb 4 oz (69.514 kg)  02/10/14 154 lb 8 oz (70.081 kg)  01/05/14 161 lb 4 oz (73.143 kg)     Patient Active Problem List   Diagnosis Date Noted  . Hyperglycemia 06/22/2011  . OVERWEIGHT 07/01/2009  . FATIGUE 07/01/2009   Past Medical History  Diagnosis Date  . Headache(784.0)    No past surgical history on file. History  Substance Use Topics  . Smoking status: Never Smoker   . Smokeless tobacco: Never Used  . Alcohol Use: Yes     Comment: occassionally   Family History  Problem Relation Age of Onset  . Arthritis Mother   . Hypertension Mother   . Hypertension Maternal Grandmother    No Known Allergies Current Outpatient Prescriptions on File Prior to Visit  Medication Sig Dispense Refill  . Multiple Vitamin (MULTIVITAMIN) tablet Take 1 tablet by mouth daily.       No current facility-administered medications on file prior to visit.     Review of Systems See HPI No CP No Palpitations No SOB     Objective:   Physical Exam BP 120/74  Pulse 76  Temp(Src) 98.3 F (36.8 C) (Oral)  Wt 153 lb 4 oz (69.514 kg)  SpO2 99%  LMP 02/27/2014  Wt Readings from Last 3 Encounters:  03/13/14 153 lb 4 oz (69.514 kg)  02/10/14 154 lb 8 oz (70.081 kg)  01/05/14 161 lb 4 oz (73.143 kg)      General:  Well-developed,well-nourished,in no acute distress; alert,appropriate and cooperative throughout examination Head:  normocephalic and atraumatic.   Lungs:  Normal respiratory effort, chest expands symmetrically. Lungs are  clear to auscultation, no crackles or wheezes. Heart:  Normal rate and regular rhythm. S1 and S2 normal without gallop, murmur, click, rub or other extra sounds. Psych:  Cognition and judgment appear intact. Alert and cooperative with normal attention span and concentration. No apparent delusions, illusions, hallucinations    Assessment & Plan:

## 2014-04-27 ENCOUNTER — Other Ambulatory Visit: Payer: Self-pay | Admitting: Family Medicine

## 2014-04-27 NOTE — Telephone Encounter (Signed)
Pt requesting medication refill. Last f/u appt 03/2014. LAst note sates pt may f/u every other month since her BP is stable.

## 2014-04-27 NOTE — Telephone Encounter (Signed)
Spoke to pt and informed her Rx has been faxed to requested pharmacy 

## 2014-05-19 ENCOUNTER — Ambulatory Visit (INDEPENDENT_AMBULATORY_CARE_PROVIDER_SITE_OTHER): Payer: 59 | Admitting: Family Medicine

## 2014-05-19 ENCOUNTER — Encounter: Payer: Self-pay | Admitting: Family Medicine

## 2014-05-19 VITALS — BP 116/66 | HR 74 | Temp 97.9°F | Ht 66.5 in | Wt 152.0 lb

## 2014-05-19 DIAGNOSIS — E663 Overweight: Secondary | ICD-10-CM

## 2014-05-19 MED ORDER — PHENTERMINE HCL 37.5 MG PO CAPS: 37.5000 mg | ORAL_CAPSULE | ORAL | Status: DC

## 2014-05-19 NOTE — Progress Notes (Signed)
  Subjective:    Patient ID: Kelsey Harrington, female    DOB: 05-31-1966, 48 y.o.   MRN: [id number]  HPI 48 yo pleasant female here for two month follow up.     Weight gain-on phentermine 37.5 mg daily. Never had any CP, SOB or palpitations.  She feels it is working well, she is exercising.  She is not yet at her goal weight- admits to not doing as well this month. Wt Readings from Last 3 Encounters:  05/19/14 152 lb (68.947 kg)  03/13/14 153 lb 4 oz (69.514 kg)  02/10/14 154 lb 8 oz (70.081 kg)     Patient Active Problem List   Diagnosis Date Noted  . Hyperglycemia 06/22/2011  . OVERWEIGHT 07/01/2009  . FATIGUE 07/01/2009   Past Medical History  Diagnosis Date  . Headache(784.0)    No past surgical history on file. History  Substance Use Topics  . Smoking status: Never Smoker   . Smokeless tobacco: Never Used  . Alcohol Use: Yes     Comment: occassionally   Family History  Problem Relation Age of Onset  . Arthritis Mother   . Hypertension Mother   . Hypertension Maternal Grandmother    No Known Allergies Current Outpatient Prescriptions on File Prior to Visit  Medication Sig Dispense Refill  . Multiple Vitamin (MULTIVITAMIN) tablet Take 1 tablet by mouth daily.       No current facility-administered medications on file prior to visit.     Review of Systems See HPI No CP No Palpitations- took her medication this am before coming in No SOB     Objective:   Physical Exam BP 116/66  Pulse 74  Temp(Src) 97.9 F (36.6 C) (Oral)  Ht 5' 6.5" (1.689 m)  Wt 152 lb (68.947 kg)  BMI 24.17 kg/m2  SpO2 98%  LMP 05/09/2014  Wt Readings from Last 3 Encounters:  05/19/14 152 lb (68.947 kg)  03/13/14 153 lb 4 oz (69.514 kg)  02/10/14 154 lb 8 oz (70.081 kg)      General:  Well-developed,well-nourished,in no acute distress; alert,appropriate and cooperative throughout examination Head:  normocephalic and atraumatic.   Lungs:  Normal respiratory effort, chest  expands symmetrically. Lungs are clear to auscultation, no crackles or wheezes. Heart:  Normal rate and regular rhythm. S1 and S2 normal without gallop, murmur, click, rub or other extra sounds. Psych:  Cognition and judgment appear intact. Alert and cooperative with normal attention span and concentration. No apparent delusions, illusions, hallucinations    Assessment & Plan:

## 2014-05-19 NOTE — Progress Notes (Signed)
Pre visit review using our clinic review tool, if applicable. No additional management support is needed unless otherwise documented below in the visit note. 

## 2014-05-19 NOTE — Assessment & Plan Note (Signed)
Improved. BMI now less than 25. Advised at follow up in 2 months, we will decrease dose and eventually wean off of it. She is reluctant but agrees to this.

## 2014-05-19 NOTE — Patient Instructions (Signed)
Great to see you. Please come see me in 2 months. 

## 2014-06-08 ENCOUNTER — Ambulatory Visit (INDEPENDENT_AMBULATORY_CARE_PROVIDER_SITE_OTHER): Payer: 59 | Admitting: Internal Medicine

## 2014-06-08 ENCOUNTER — Encounter: Payer: Self-pay | Admitting: Internal Medicine

## 2014-06-08 VITALS — BP 118/76 | HR 68 | Temp 98.0°F | Wt 152.0 lb

## 2014-06-08 DIAGNOSIS — J069 Acute upper respiratory infection, unspecified: Secondary | ICD-10-CM

## 2014-06-08 DIAGNOSIS — B9789 Other viral agents as the cause of diseases classified elsewhere: Principal | ICD-10-CM

## 2014-06-08 MED ORDER — BENZONATATE 200 MG PO CAPS: 200.0000 mg | ORAL_CAPSULE | Freq: Three times a day (TID) | ORAL | Status: DC | PRN

## 2014-06-08 MED ORDER — HYDROCODONE-HOMATROPINE 5-1.5 MG/5ML PO SYRP: 5.0000 mL | ORAL_SOLUTION | Freq: Three times a day (TID) | ORAL | Status: DC | PRN

## 2014-06-08 NOTE — Progress Notes (Signed)
Pre visit review using our clinic review tool, if applicable. No additional management support is needed unless otherwise documented below in the visit note. 

## 2014-06-08 NOTE — Patient Instructions (Signed)
Cough, Adult  A cough is a reflex that helps clear your throat and airways. It can help heal the body or may be a reaction to an irritated airway. A cough may only last 2 or 3 weeks (acute) or may last more than 8 weeks (chronic).  CAUSES Acute cough:  Viral or bacterial infections. Chronic cough:  Infections.  Allergies.  Asthma.  Post-nasal drip.  Smoking.  Heartburn or acid reflux.  Some medicines.  Chronic lung problems (COPD).  Cancer. SYMPTOMS   Cough.  Fever.  Chest pain.  Increased breathing rate.  High-pitched whistling sound when breathing (wheezing).  Colored mucus that you cough up (sputum). TREATMENT   A bacterial cough may be treated with antibiotic medicine.  A viral cough must run its course and will not respond to antibiotics.  Your caregiver may recommend other treatments if you have a chronic cough. HOME CARE INSTRUCTIONS   Only take over-the-counter or prescription medicines for pain, discomfort, or fever as directed by your caregiver. Use cough suppressants only as directed by your caregiver.  Use a cold steam vaporizer or humidifier in your bedroom or home to help loosen secretions.  Sleep in a semi-upright position if your cough is worse at night.  Rest as needed.  Stop smoking if you smoke. SEEK IMMEDIATE MEDICAL CARE IF:   You have pus in your sputum.  Your cough starts to worsen.  You cannot control your cough with suppressants and are losing sleep.  You begin coughing up blood.  You have difficulty breathing.  You develop pain which is getting worse or is uncontrolled with medicine.  You have a fever. MAKE SURE YOU:   Understand these instructions.  Will watch your condition.  Will get help right away if you are not doing well or get worse. Document Released: 03/24/2011 Document Revised: 12/18/2011 Document Reviewed: 03/24/2011 Exit[employer] Patient Information 2015 [employer]. This information is not intended  to replace advice given to you by your health care provider. Make sure you discuss any questions you have with your health care provider.  

## 2014-06-08 NOTE — Progress Notes (Addendum)
HPI  Pt presents to the clinic today with c/o cough, headache, nasal congestion and facial pressure. She reports this started 4 days ago. The cough is non productive. She is blowing clear mucous out of her nose. She denies fever, chills or body aches. She has tried Tyelenol without much relief. She has no history of allergies or breathing problems. Her son has had similar symptoms.  Review of Systems    Past Medical History  Diagnosis Date  . Headache(784.0)     Family History  Problem Relation Age of Onset  . Arthritis Mother   . Hypertension Mother   . Hypertension Maternal Grandmother     History   Social History  . Marital Status: Married    Spouse Name: N/A    Number of Children: N/A  . Years of Education: N/A   Occupational History  . Not on file.   Social History Main Topics  . Smoking status: Never Smoker   . Smokeless tobacco: Never Used  . Alcohol Use: Yes     Comment: occassionally  . Drug Use: No  . Sexual Activity: Not on file   Other Topics Concern  . Not on file   Social History Narrative  . No narrative on file    No Known Allergies   Constitutional: Positive headache, fatigue. Denies fever or abrupt weight changes.  HEENT:  Positive facial pain, nasal congestion and sore throat. Denies eye redness, ear pain, ringing in the ears, wax buildup, runny nose or bloody nose. Respiratory: Positive cough. Denies difficulty breathing or shortness of breath.  Cardiovascular: Denies chest pain, chest tightness, palpitations or swelling in the hands or feet.   No other specific complaints in a complete review of systems (except as listed in HPI above).  Objective:   BP 118/76  Pulse 68  Temp(Src) 98 F (36.7 C) (Oral)  Wt 152 lb (68.947 kg)  SpO2 98%  LMP 05/09/2014  General: Appears her stated age, well developed, well nourished in NAD. HEENT: Head: normal shape and size, no sinus tenderness noted;  Ears: Tm's gray and intact, normal light  reflex; Nose: mucosa pink and moist, septum midline; Throat/Mouth: + PND. Teeth present, mucosa pink and moist, no exudate noted, no lesions or ulcerations noted.  Cardiovascular: Normal rate and rhythm. S1,S2 noted.  No murmur, rubs or gallops noted. Pulmonary/Chest: Normal effort and positive vesicular breath sounds. No respiratory distress. No wheezes, rales or ronchi noted.      Assessment & Plan:   Viral URI with cough  Continue tylenol or Ibuprofen Salt water gargles for the sore throat Tessalon pearles during the day RX for Hycodan at night  RTC as needed or if symptoms persist.

## 2014-07-06 ENCOUNTER — Other Ambulatory Visit: Payer: Self-pay | Admitting: Family Medicine

## 2014-07-07 NOTE — Telephone Encounter (Signed)
Pt requesting medication refill. Last f/u appt 05/2014. Ok to fill per Dr Deborra Medina. Rx will be faxed to requested pharmacy before end of day,today.

## 2014-07-21 ENCOUNTER — Encounter: Payer: Self-pay | Admitting: Family Medicine

## 2014-07-21 ENCOUNTER — Ambulatory Visit (INDEPENDENT_AMBULATORY_CARE_PROVIDER_SITE_OTHER): Payer: 59 | Admitting: Family Medicine

## 2014-07-21 VITALS — BP 104/70 | HR 68 | Temp 97.6°F | Wt 151.0 lb

## 2014-07-21 DIAGNOSIS — E663 Overweight: Secondary | ICD-10-CM

## 2014-07-21 DIAGNOSIS — Z23 Encounter for immunization: Secondary | ICD-10-CM

## 2014-07-21 MED ORDER — PHENTERMINE HCL 37.5 MG PO CAPS: 37.5000 mg | ORAL_CAPSULE | ORAL | Status: DC

## 2014-07-21 MED ORDER — PHENTERMINE HCL 30 MG PO CAPS: 30.0000 mg | ORAL_CAPSULE | ORAL | Status: DC

## 2014-07-21 NOTE — Patient Instructions (Signed)
Great to see you. We are weaning you off of phentermine now that your BMI is normal. Keep up the good work!

## 2014-07-21 NOTE — Assessment & Plan Note (Signed)
Now with normal BMI. Will wean off phentermine. Discussed plan with Baker Janus. The patient indicates understanding of these issues and agrees with the plan.

## 2014-07-21 NOTE — Progress Notes (Signed)
Pre visit review using our clinic review tool, if applicable. No additional management support is needed unless otherwise documented below in the visit note. 

## 2014-07-21 NOTE — Progress Notes (Signed)
  Subjective:    Patient ID: Kelsey Harrington, female    DOB: [birth year], 48 y.o.   MRN: [id number]  HPI 48 yo pleasant female here for two month follow up.    Weight gain-on phentermine 37.5 mg daily. Never had any CP, SOB or palpitations.  She feels it is working well, she is exercising.  She is now close to her goal weight.  Wt Readings from Last 3 Encounters:  07/21/14 151 lb (68.493 kg)  06/08/14 152 lb (68.947 kg)  05/19/14 152 lb (68.947 kg)     Patient Active Problem List   Diagnosis Date Noted  . Hyperglycemia 06/22/2011  . Overweight 07/01/2009  . FATIGUE 07/01/2009   Past Medical History  Diagnosis Date  . Headache(784.0)    No past surgical history on file. History  Substance Use Topics  . Smoking status: Never Smoker   . Smokeless tobacco: Never Used  . Alcohol Use: Yes     Comment: occassionally   Family History  Problem Relation Age of Onset  . Arthritis Mother   . Hypertension Mother   . Hypertension Maternal Grandmother    No Known Allergies No current outpatient prescriptions on file prior to visit.   No current facility-administered medications on file prior to visit.     Review of Systems See HPI No CP No Palpitations- took her medication this am before coming in No SOB     Objective:   Physical Exam BP 104/70  Pulse 68  Temp(Src) 97.6 F (36.4 C) (Oral)  Wt 151 lb (68.493 kg)  SpO2 99%  LMP 06/25/2014  Wt Readings from Last 3 Encounters:  07/21/14 151 lb (68.493 kg)  06/08/14 152 lb (68.947 kg)  05/19/14 152 lb (68.947 kg)      General:  Well-developed,well-nourished,in no acute distress; alert,appropriate and cooperative throughout examination Head:  normocephalic and atraumatic.   Lungs:  Normal respiratory effort, chest expands symmetrically. Lungs are clear to auscultation, no crackles or wheezes. Heart:  Normal rate and regular rhythm. S1 and S2 normal without gallop, murmur, click, rub or other extra sounds. Psych:   Cognition and judgment appear intact. Alert and cooperative with normal attention span and concentration. No apparent delusions, illusions, hallucinations    Assessment & Plan:

## 2014-07-22 ENCOUNTER — Encounter: Payer: 59 | Admitting: Family Medicine

## 2014-07-28 ENCOUNTER — Encounter: Payer: 59 | Admitting: Family Medicine

## 2015-04-28 ENCOUNTER — Other Ambulatory Visit: Payer: Self-pay | Admitting: Family Medicine

## 2015-04-28 DIAGNOSIS — Z0000 Encounter for general adult medical examination without abnormal findings: Secondary | ICD-10-CM

## 2015-05-07 ENCOUNTER — Other Ambulatory Visit (INDEPENDENT_AMBULATORY_CARE_PROVIDER_SITE_OTHER): Payer: 59

## 2015-05-07 DIAGNOSIS — Z0000 Encounter for general adult medical examination without abnormal findings: Secondary | ICD-10-CM | POA: Diagnosis not present

## 2015-05-07 LAB — COMPREHENSIVE METABOLIC PANEL
ALBUMIN: 4.2 g/dL (ref 3.5–5.2)
ALT: 18 U/L (ref 0–35)
AST: 20 U/L (ref 0–37)
Alkaline Phosphatase: 38 U/L — ABNORMAL LOW (ref 39–117)
BILIRUBIN TOTAL: 0.3 mg/dL (ref 0.2–1.2)
BUN: 16 mg/dL (ref 6–23)
CO2: 28 mEq/L (ref 19–32)
CREATININE: 0.81 mg/dL (ref 0.40–1.20)
Calcium: 9.2 mg/dL (ref 8.4–10.5)
Chloride: 106 mEq/L (ref 96–112)
GFR: 79.83 mL/min (ref >60.00)
Glucose, Bld: 83 mg/dL (ref 70–99)
POTASSIUM: 4.2 meq/L (ref 3.5–5.1)
SODIUM: 139 meq/L (ref 135–145)
Total Protein: 6.9 g/dL (ref 6.0–8.3)

## 2015-05-07 LAB — CBC WITH DIFFERENTIAL/PLATELET
BASOS PCT: 0.7 % (ref 0.0–3.0)
Basophils Absolute: 0 10*3/uL (ref 0.0–0.1)
Eosinophils Absolute: 0.2 10*3/uL (ref 0.0–0.7)
Eosinophils Relative: 3.4 % (ref 0.0–5.0)
HCT: 38 % (ref 36.0–46.0)
Hemoglobin: 12.8 g/dL (ref 12.0–15.0)
Lymphocytes Relative: 31.8 % (ref 12.0–46.0)
Lymphs Abs: 2.3 10*3/uL (ref 0.7–4.0)
MCHC: 33.6 g/dL (ref 30.0–36.0)
MCV: 92 fl (ref 78.0–100.0)
Monocytes Absolute: 0.6 10*3/uL (ref 0.1–1.0)
Monocytes Relative: 8.5 % (ref 3.0–12.0)
NEUTROS PCT: 55.6 % (ref 43.0–77.0)
Neutro Abs: 3.9 10*3/uL (ref 1.4–7.7)
PLATELETS: 359 10*3/uL (ref 150.0–400.0)
RBC: 4.13 Mil/uL (ref 3.87–5.11)
RDW: 14.5 % (ref 11.5–15.5)
WBC: 7.1 10*3/uL (ref 4.0–10.5)

## 2015-05-07 LAB — LIPID PANEL
Cholesterol: 195 mg/dL (ref 0–200)
HDL: 69.8 mg/dL (ref >39.00)
LDL CALC: 115 mg/dL — AB (ref 0–99)
NONHDL: 124.79
Total CHOL/HDL Ratio: 3
Triglycerides: 47 mg/dL (ref 0.0–149.0)
VLDL: 9.4 mg/dL (ref 0.0–40.0)

## 2015-05-07 LAB — TSH: TSH: 1.99 u[IU]/mL (ref 0.35–4.50)

## 2015-05-12 ENCOUNTER — Encounter: Payer: Self-pay | Admitting: Family Medicine

## 2015-05-12 ENCOUNTER — Ambulatory Visit (INDEPENDENT_AMBULATORY_CARE_PROVIDER_SITE_OTHER): Payer: 59 | Admitting: Family Medicine

## 2015-05-12 VITALS — BP 118/70 | HR 64 | Temp 97.9°F | Ht 67.0 in | Wt 177.0 lb

## 2015-05-12 DIAGNOSIS — Z0000 Encounter for general adult medical examination without abnormal findings: Secondary | ICD-10-CM

## 2015-05-12 DIAGNOSIS — M7662 Achilles tendinitis, left leg: Secondary | ICD-10-CM | POA: Diagnosis not present

## 2015-05-12 DIAGNOSIS — M7660 Achilles tendinitis, unspecified leg: Secondary | ICD-10-CM | POA: Insufficient documentation

## 2015-05-12 DIAGNOSIS — Z1239 Encounter for other screening for malignant neoplasm of breast: Secondary | ICD-10-CM | POA: Diagnosis not present

## 2015-05-12 DIAGNOSIS — F32A Depression, unspecified: Secondary | ICD-10-CM

## 2015-05-12 DIAGNOSIS — F329 Major depressive disorder, single episode, unspecified: Secondary | ICD-10-CM | POA: Diagnosis not present

## 2015-05-12 DIAGNOSIS — Z01419 Encounter for gynecological examination (general) (routine) without abnormal findings: Secondary | ICD-10-CM

## 2015-05-12 MED ORDER — BUPROPION HCL ER (XL) 150 MG PO TB24: 150.0000 mg | ORAL_TABLET | Freq: Every day | ORAL | Status: DC

## 2015-05-12 NOTE — Progress Notes (Signed)
Subjective:    Patient ID: Kelsey Harrington, female    DOB: [birth year], 49 y.o.   MRN: [id number]  HPI 49 yo pleasant female here for CPX.  Doing well.  J  Last pap smear was done here in 11/05/13 normal.  Denies any dysuria, vaginal discharge or other GU complaints.  Due for mammogram- 11/07/13.   HLD- improved without rx!  Lab Results  Component Value Date   CHOL 195 05/07/2015   HDL 69.80 05/07/2015   LDLCALC 115* 05/07/2015   LDLDIRECT 140.0 06/16/2011   TRIG 47.0 05/07/2015   CHOLHDL 3 05/07/2015   Has been more sad, tearful lately.  Not sleeping well. Feels less drive to get out of bed and do things.  Has not taking any rx for anxiety or depression "in decades." No SI or HI.  Having difficulty concentrating at work. More stressors at home and at work.  Having pain behind her left heel after standings for long periods of time. No swelling.  Wt Readings from Last 3 Encounters:  05/12/15 177 lb (80.287 kg)  07/21/14 151 lb (68.493 kg)  06/08/14 152 lb (68.947 kg)   Lab Results  Component Value Date   WBC 7.1 05/07/2015   HGB 12.8 05/07/2015   HCT 38.0 05/07/2015   MCV 92.0 05/07/2015   PLT 359.0 05/07/2015   Lab Results  Component Value Date   NA 139 05/07/2015   K 4.2 05/07/2015   CL 106 05/07/2015   CO2 28 05/07/2015   Lab Results  Component Value Date   ALT 18 05/07/2015   AST 20 05/07/2015   ALKPHOS 38* 05/07/2015   BILITOT 0.3 05/07/2015   Lab Results  Component Value Date   TSH 1.99 05/07/2015    Patient Active Problem List   Diagnosis Date Noted  . Well woman exam 05/12/2015  . Hyperglycemia 06/22/2011  . Overweight 07/01/2009  . FATIGUE 07/01/2009   Past Medical History  Diagnosis Date  . Headache(784.0)    No past surgical history on file. History  Substance Use Topics  . Smoking status: Never Smoker   . Smokeless tobacco: Never Used  . Alcohol Use: Yes     Comment: occassionally   Family History  Problem Relation Age of Onset  .  Arthritis Mother   . Hypertension Mother   . Hypertension Maternal Grandmother    No Known Allergies No current outpatient prescriptions on file prior to visit.   No current facility-administered medications on file prior to visit.     Review of Systems Review of Systems  Constitutional: Negative.   HENT: Negative.   Respiratory: Negative.   Cardiovascular: Negative.   Gastrointestinal: Negative.   Endocrine: Negative.   Genitourinary: Negative.   Musculoskeletal: Positive for arthralgias.  Allergic/Immunologic: Negative.   Neurological: Negative.   Hematological: Negative.   Psychiatric/Behavioral: Positive for sleep disturbance, dysphoric mood and decreased concentration. Negative for suicidal ideas and self-injury. The patient is nervous/anxious.   All other systems reviewed and are negative.  .       Objective:   Physical Exam BP 118/70 mmHg  Pulse 64  Temp(Src) 97.9 F (36.6 C) (Oral)  Ht 5\' 7"  (1.702 m)  Wt 177 lb (80.287 kg)  BMI 27.72 kg/m2  SpO2 94%  LMP 05/01/2015 Wt Readings from Last 3 Encounters:  05/12/15 177 lb (80.287 kg)  07/21/14 151 lb (68.493 kg)  06/08/14 152 lb (68.947 kg)    General:  Well-developed,well-nourished,in no acute distress; alert,appropriate and cooperative throughout examination Head:  normocephalic and atraumatic.   Eyes:  vision grossly intact, pupils equal, pupils round, and pupils reactive to light.   Ears:  R ear normal and L ear normal.   Nose:  no external deformity.   Mouth:  good dentition.   Neck:  No deformities, masses, or tenderness noted. Breasts:  No mass, nodules, thickening, tenderness, bulging, retraction, inflamation, nipple discharge or skin changes noted.   Lungs:  Normal respiratory effort, chest expands symmetrically. Lungs are clear to auscultation, no crackles or wheezes. Heart:  Normal rate and regular rhythm. S1 and S2 normal without gallop, murmur, click, rub or other extra sounds. Msk:  No  deformity or scoliosis noted of thoracic or lumbar spine.   Extremities:  No clubbing, cyanosis, edema, or deformity noted with normal full range of motion of all joints.   Neurologic:  alert & oriented X3 and gait normal.   Skin:  Intact without suspicious lesions or rashes Psych:  Tearful Cognition and judgment appear intact. Alert and cooperative with normal attention span and concentration. No apparent delusions, illusions, hallucinations    Assessment & Plan:

## 2015-05-12 NOTE — Assessment & Plan Note (Signed)
Given stretches to follow. Call or return to clinic prn if these symptoms worsen or fail to improve as anticipated. The patient indicates understanding of these issues and agrees with the plan.

## 2015-05-12 NOTE — Progress Notes (Signed)
Pre visit review using our clinic review tool, if applicable. No additional management support is needed unless otherwise documented below in the visit note. 

## 2015-05-12 NOTE — Assessment & Plan Note (Signed)
New- Total time spent with patient was 65 minutes, with 25 minutes spent specifically on problem visit concerning depression. Agrees to pharmacotherapy. eRx sent for Wellbutrin 150 mg XL daily. Follow up in 3-4 weeks by phone.

## 2015-05-12 NOTE — Assessment & Plan Note (Signed)
Reviewed preventive care protocols, scheduled due services, and updated immunizations Discussed nutrition, exercise, diet, and healthy lifestyle.  Mammogram ordered- pt to schedule.

## 2015-05-12 NOTE — Patient Instructions (Addendum)
Good to see you. We are starting Wellbutrin 150 mg XL daily. Please call me in 3-4 weeks with an update.  Try the stretches for your achilles and keep me updated.  Please call to set up your mammogram.

## 2015-07-22 ENCOUNTER — Other Ambulatory Visit: Payer: Self-pay | Admitting: *Deleted

## 2015-07-22 ENCOUNTER — Telehealth: Payer: Self-pay | Admitting: *Deleted

## 2015-07-22 MED ORDER — BUPROPION HCL ER (XL) 150 MG PO TB24: 150.0000 mg | ORAL_TABLET | Freq: Every day | ORAL | Status: DC

## 2015-07-22 MED ORDER — BUPROPION HCL ER (XL) 300 MG PO TB24: 300.0000 mg | ORAL_TABLET | Freq: Every day | ORAL | Status: DC

## 2015-07-22 NOTE — Telephone Encounter (Signed)
Received voicemail at Triage. Pharmacy call requesting refill of Rx, last refilled on 05/12/15 #30 with 1 refill, pharmacy said we can just send Rx electronically

## 2015-07-22 NOTE — Telephone Encounter (Signed)
Patient requesting increase on Wellbutrin XL 150 mg or new medication if possible.  She reports an increase in anxiety attacks.  No other issues on this medication.  Please advise.

## 2015-07-22 NOTE — Telephone Encounter (Signed)
Noted- higher dose- wellbutrin 300 mg XL sent to pharmacy.  Please keep [place] updated.

## 2015-09-06 ENCOUNTER — Telehealth: Payer: Self-pay | Admitting: Family Medicine

## 2015-09-06 ENCOUNTER — Ambulatory Visit (INDEPENDENT_AMBULATORY_CARE_PROVIDER_SITE_OTHER)
Admission: RE | Admit: 2015-09-06 | Discharge: 2015-09-06 | Disposition: A | Discharge status: Home / Self Care | Payer: 59 | Source: Ambulatory Visit | Attending: Family Medicine | Admitting: Family Medicine

## 2015-09-06 ENCOUNTER — Encounter: Payer: Self-pay | Admitting: Family Medicine

## 2015-09-06 ENCOUNTER — Ambulatory Visit (INDEPENDENT_AMBULATORY_CARE_PROVIDER_SITE_OTHER): Payer: 59 | Admitting: Family Medicine

## 2015-09-06 VITALS — BP 112/74 | HR 65 | Temp 98.2°F | Wt 184.5 lb

## 2015-09-06 DIAGNOSIS — R1030 Lower abdominal pain, unspecified: Secondary | ICD-10-CM

## 2015-09-06 DIAGNOSIS — R1031 Right lower quadrant pain: Secondary | ICD-10-CM

## 2015-09-06 DIAGNOSIS — Z23 Encounter for immunization: Secondary | ICD-10-CM

## 2015-09-06 DIAGNOSIS — R102 Pelvic and perineal pain: Secondary | ICD-10-CM | POA: Insufficient documentation

## 2015-09-06 DIAGNOSIS — R109 Unspecified abdominal pain: Secondary | ICD-10-CM | POA: Diagnosis not present

## 2015-09-06 MED ORDER — IOHEXOL 300 MG/ML  SOLN
100.0000 mL | Freq: Once | INTRAMUSCULAR | Status: AC | PRN
  Administered 2015-09-06: 100 mL via INTRAVENOUS

## 2015-09-06 NOTE — Patient Instructions (Signed)
Good to see you. Please stop by to see Rosaria Ferries on your way out. I will call you as soon as I have the results.

## 2015-09-06 NOTE — Progress Notes (Signed)
Subjective:   Patient ID: Kelsey Harrington, female    DOB: 1966/05/23, 49 y.o.   MRN: [id number]  Kelsey Harrington is a pleasant 49 y.o. year old female who presents to clinic today with Flank Pain  on 09/06/2015  HPI:  RLQ pain/suprapubic pain- started over a week ago- 8/10 pain, constant, seems to be worse with sitting up and laying on her left side. Started her period several days later and pain seemed to improve, now it has returned with the same intensity. She is not sure if anything makes it better- she has not tried taking any OTC rxs for it.  No associated nausea or vomiting.  Has had some diarrhea.  No dysuria.  No flank pain. Unsure if she has had hematuria since she is currently on her period.  Never had anything like this before.  No fevers. No known injury.  Walking does not seen to make it worse.  Current Outpatient Prescriptions on File Prior to Visit  Medication Sig Dispense Refill  . buPROPion (WELLBUTRIN XL) 300 MG 24 hr tablet Take 1 tablet (300 mg total) by mouth daily. 30 tablet 3   No current facility-administered medications on file prior to visit.    No Known Allergies  Past Medical History  Diagnosis Date  . Headache(784.0)     No past surgical history on file.  Family History  Problem Relation Age of Onset  . Arthritis Mother   . Hypertension Mother   . Hypertension Maternal Grandmother     Social History   Social History  . Marital Status: Married    Spouse Name: N/A  . Number of Children: N/A  . Years of Education: N/A   Occupational History  . Not on file.   Social History Main Topics  . Smoking status: Never Smoker   . Smokeless tobacco: Never Used  . Alcohol Use: Yes     Comment: occassionally  . Drug Use: No  . Sexual Activity: Not on file   Other Topics Concern  . Not on file   Social History Narrative   The PMH, PSH, Social History, Family History, Medications, and allergies have been reviewed in [facility], and have been  updated if relevant.   Review of Systems  Constitutional: Negative.   Eyes: Negative.   Gastrointestinal: Positive for abdominal pain. Negative for nausea, vomiting, constipation, blood in stool, abdominal distention, anal bleeding and rectal pain.  Genitourinary: Negative.   Musculoskeletal: Negative.   Neurological: Negative.   All other systems reviewed and are negative.      Objective:    BP 112/74 mmHg  Pulse 65  Temp(Src) 98.2 F (36.8 C) (Oral)  Wt 184 lb 8 oz (83.689 kg)  SpO2 99%  LMP 09/03/2015   Physical Exam  Constitutional: She is oriented to person, place, and time. She appears well-developed and well-nourished. No distress.  HENT:  Head: Normocephalic.  Eyes: Conjunctivae are normal.  Neck: Normal range of motion.  Cardiovascular: Normal rate.   Pulmonary/Chest: Effort normal.  Abdominal: Bowel sounds are normal. She exhibits no distension and no mass. There is tenderness. There is rebound and guarding.  Musculoskeletal: Normal range of motion.  Neurological: She is alert and oriented to person, place, and time. No cranial nerve deficit.  Skin: Skin is dry.  Psychiatric: She has a normal mood and affect. Her behavior is normal. Judgment and thought content normal.  Nursing note and vitals reviewed.         Assessment & Plan:  RLQ abdominal pain - Plan: CT Abdomen Pelvis Wo Contrast  Need for influenza vaccination - Plan: Flu Vaccine QUAD 36+ mos PF IM (Fluarix & Fluzone Quad PF)  Flank pain - Plan: Urine culture, CANCELED: Urinalysis Dipstick  Suprapubic pain, unspecified laterality No Follow-up on file.

## 2015-09-06 NOTE — Progress Notes (Signed)
Pre visit review using our clinic review tool, if applicable. No additional management support is needed unless otherwise documented below in the visit note. 

## 2015-09-06 NOTE — Assessment & Plan Note (Signed)
New-Urine sent for micro (unable to dip due to gross blood) but symptoms not classic for UTI. ? Ovarian cyst vs appendix vs other abdominal/pelvic etiology. Transvaginal US likely difficult as she is currently bleeding. Will proceed with CT of abd and pelvis which will allow better visualization to rule out more pathology. The patient indicates understanding of these issues and agrees with the plan.

## 2015-09-06 NOTE — Telephone Encounter (Signed)
Pt called wanted to talk to you about test results she had done 11/28 Please call pt

## 2015-09-07 LAB — URINE CULTURE: Colony Count: 5000

## 2015-09-07 NOTE — Telephone Encounter (Signed)
Spoke to pt and relayed results, again. Advised her that if pain persist, after duration of period, she may contact office back to have u/s scheduled. She is questioning what she should do regarding pain, in the meantime.

## 2015-09-07 NOTE — Telephone Encounter (Signed)
If pain is that severe, we may need to proceed now with ultrasound.  Does alleve/Ibuprofen not help?

## 2015-09-08 NOTE — Telephone Encounter (Signed)
Spoke to pt and advised per Dr Deborra Medina. Pt had not taken anything for the pain, but will begin taking aleve as advised. Pt states she prefers to wait until the completion of her period before having u/s, and will contact office back when done.

## 2015-09-17 ENCOUNTER — Telehealth: Payer: Self-pay

## 2015-09-17 DIAGNOSIS — R1031 Right lower quadrant pain: Secondary | ICD-10-CM

## 2015-09-17 DIAGNOSIS — R1030 Lower abdominal pain, unspecified: Secondary | ICD-10-CM

## 2015-09-17 NOTE — Telephone Encounter (Signed)
Referral placed.

## 2015-09-17 NOTE — Telephone Encounter (Signed)
Pt left v/m requesting [place] to be scheduled that was mentioned in result note of CT of abd (09-06-15). Dr Deborra Medina out of office; sent to Dr Deborra Medina and Avie Echevaria NP.

## 2015-11-03 ENCOUNTER — Telehealth: Payer: Self-pay | Admitting: Family Medicine

## 2015-11-03 NOTE — Telephone Encounter (Signed)
Noted. Order canceled

## 2015-11-03 NOTE — Telephone Encounter (Signed)
Bonnita Nasuti spoke with patient and she says she is not having any more pain and doesn't want to have the [place]. Please cancel the order and if she has any more problems she will call you.

## 2016-01-25 ENCOUNTER — Other Ambulatory Visit: Payer: Self-pay | Admitting: Family Medicine

## 2016-05-01 IMAGING — CT CT ABD-PELV W/ CM
2 of 5 series · 17 of 46 positions shown, 19 images · IV contrast (Omnipaque 300)
Comparison: None.

CLINICAL DATA: Right lower quadrant tenderness for 1 week. Evaluate
for appendicitis.

EXAM:
CT ABDOMEN AND PELVIS WITH CONTRAST
TECHNIQUE: Multidetector CT imaging of the abdomen and pelvis was performed
using the standard protocol following bolus administration of
intravenous contrast.
CONTRAST:  100mL OMNIPAQUE IOHEXOL 300 MG/ML  SOLN

[Series 2: abd/ pel 5mm · axial · 0.67mm/px · z∈[-474,-34]mm · 14 of 98 slices shown, 16 images]
[im 5/98  soft-tissue]
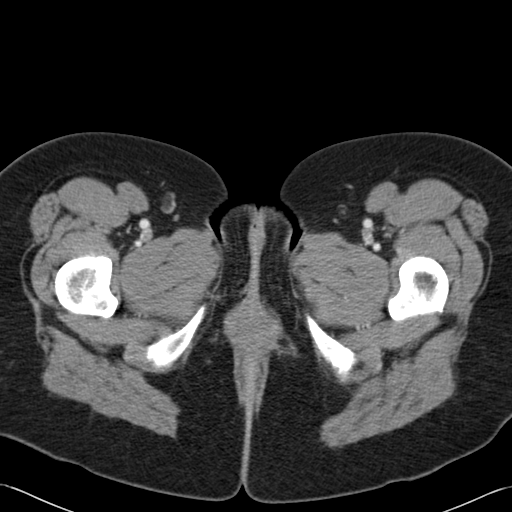
[im 5/98  bone]
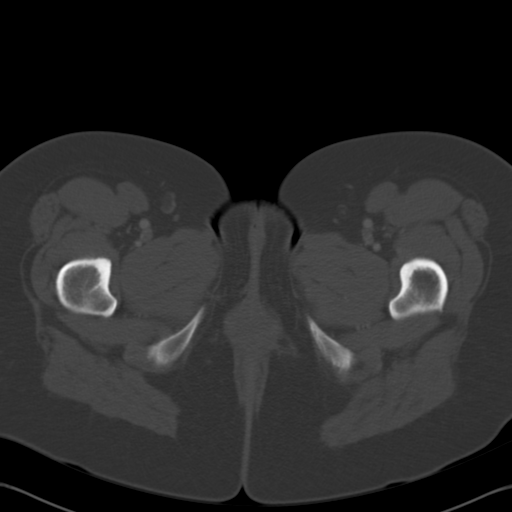
[im 15/98  soft-tissue]
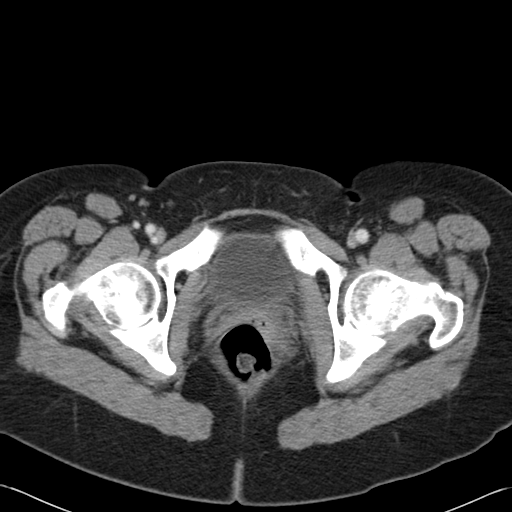
[im 20/98  soft-tissue]
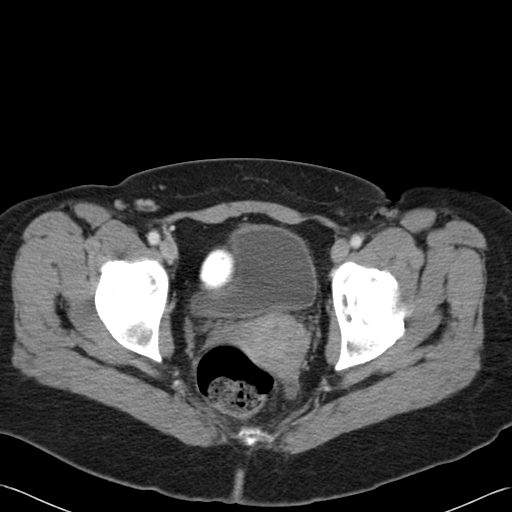
[im 25/98  soft-tissue]
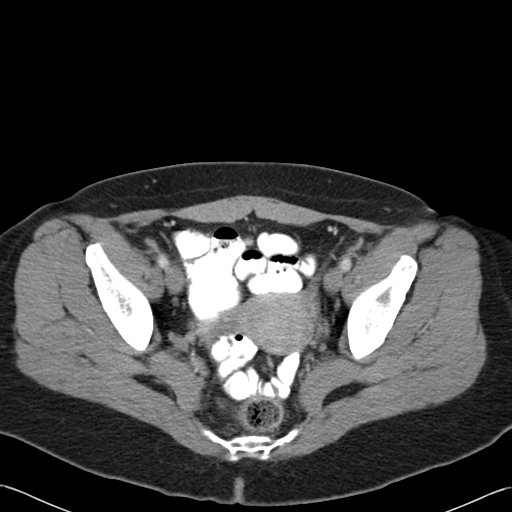
[im 34/98  soft-tissue]
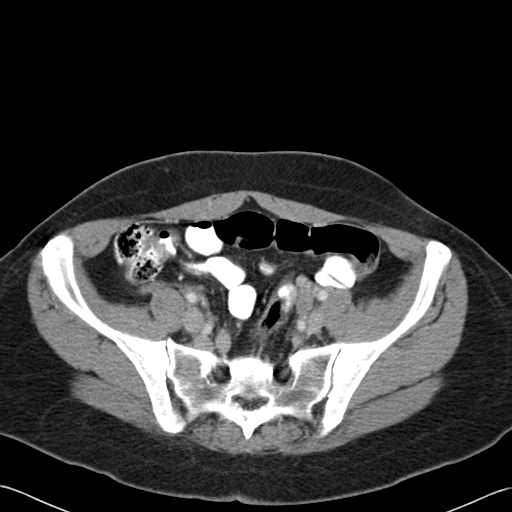
[im 39/98  soft-tissue]
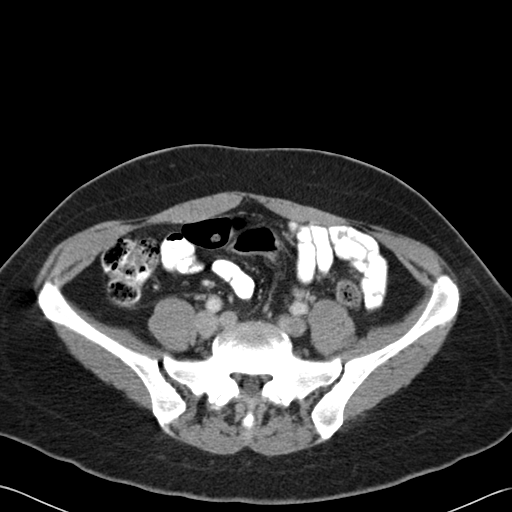
[im 44/98  soft-tissue]
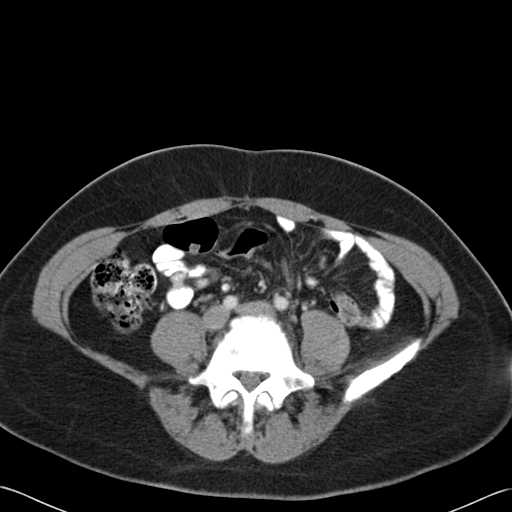
[im 54/98  soft-tissue]
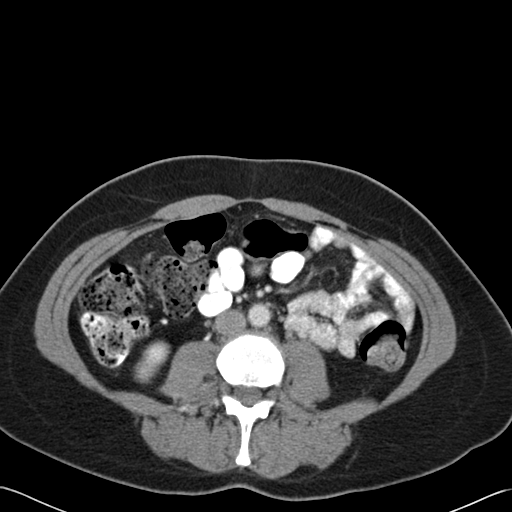
[im 59/98  soft-tissue]
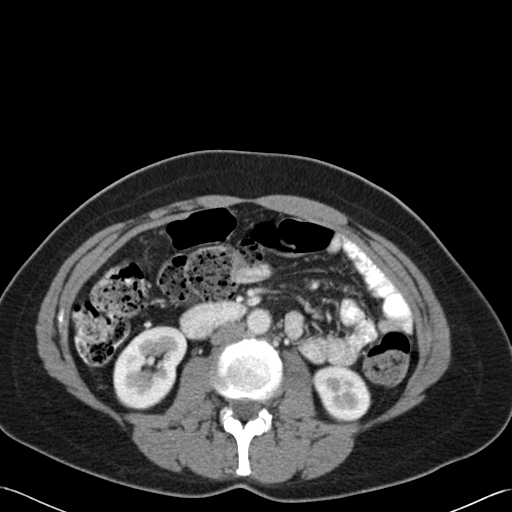
[im 59/98  bone]
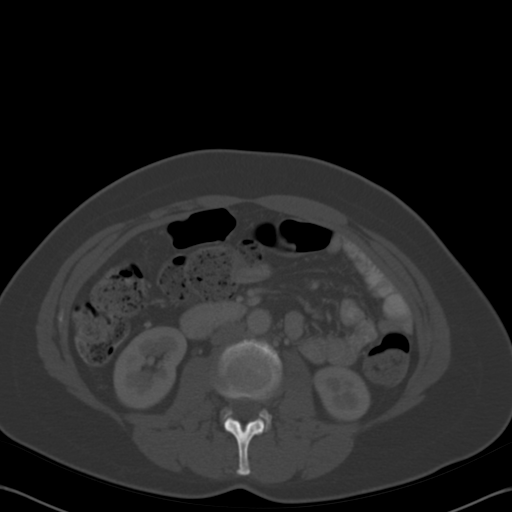
[im 64/98  soft-tissue]
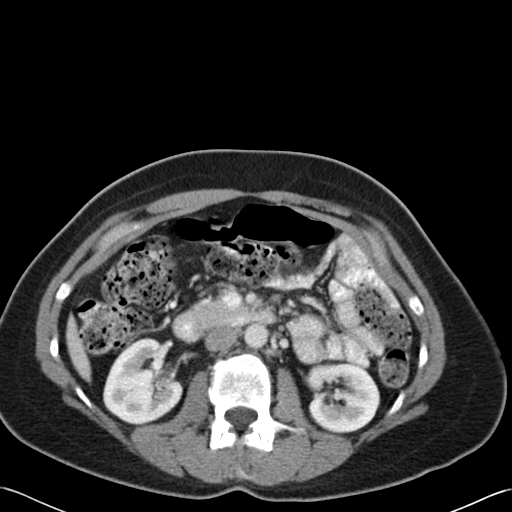
[im 73/98  soft-tissue]
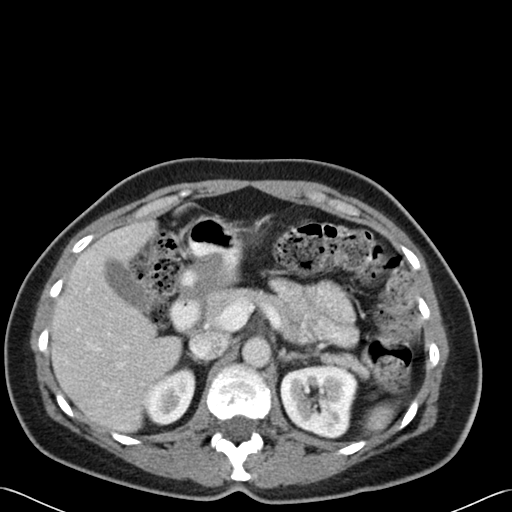
[im 78/98  soft-tissue]
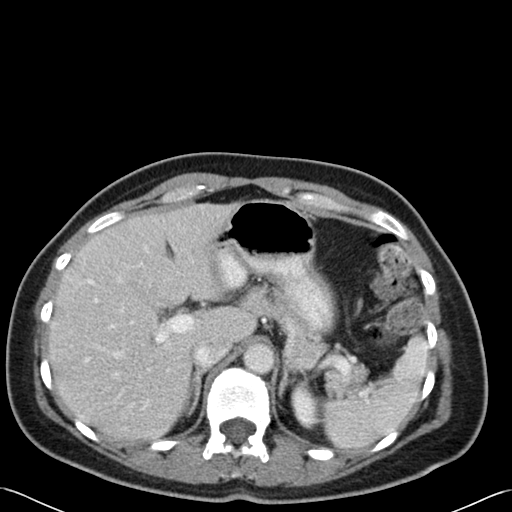
[im 83/98  soft-tissue]
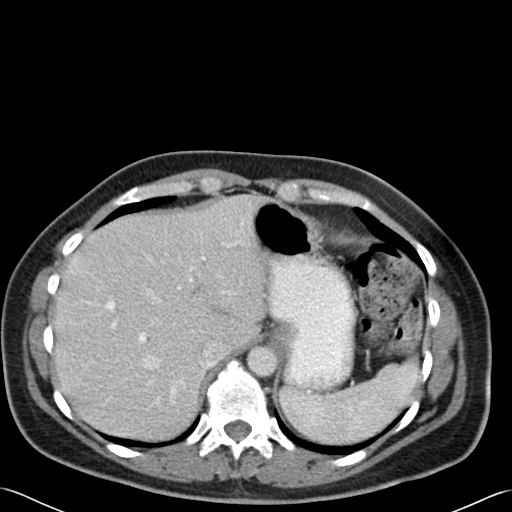
[im 93/98  soft-tissue]
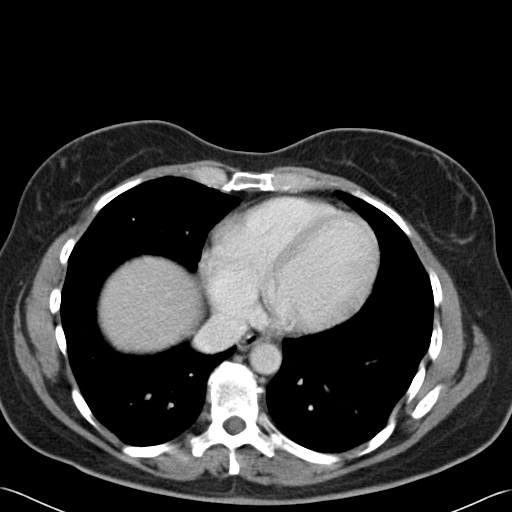

[Series 602: cor · coronal · 0.98mm/px · 3 of 116 slices shown]
[im 39/116  soft-tissue]
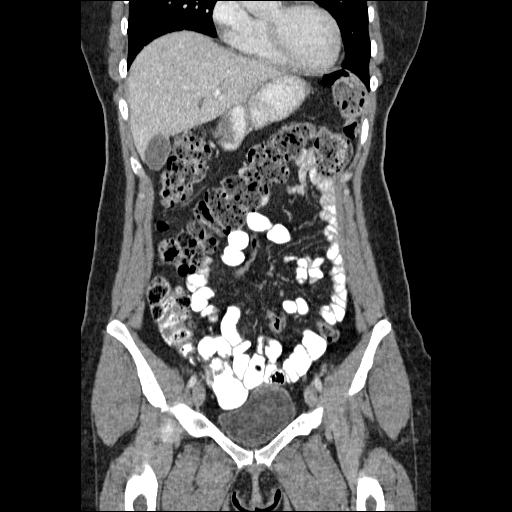
[im 52/116  soft-tissue]
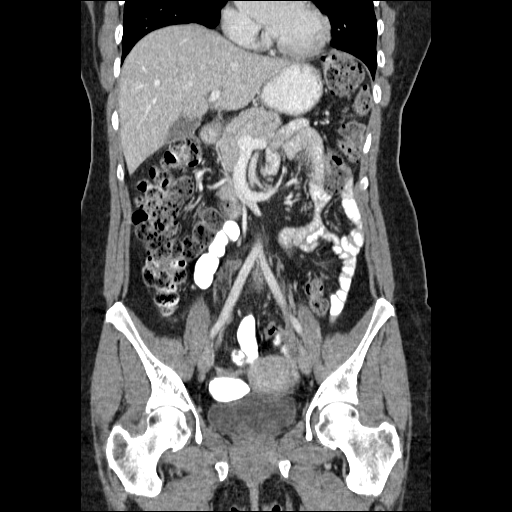
[im 64/116  soft-tissue]
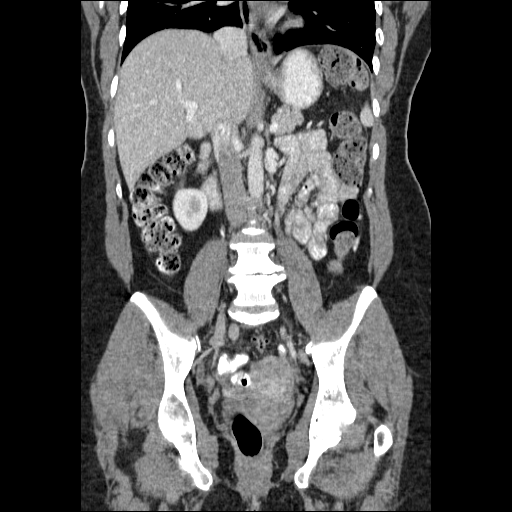

[17 of 46 positions shown; findings below may reference images not displayed]

FINDINGS: Lower chest and abdominal wall:  No contributory findings.

Hepatobiliary: Sub cm cystic focus near the middle hepatic vein. No
significant hepatic finding.No evidence of biliary obstruction or
stone.

Pancreas: Unremarkable.

Spleen: Unremarkable.

Adrenals/Urinary Tract: Negative adrenals. No hydronephrosis or
stone. Unremarkable bladder.

Reproductive:No pathologic findings.

Stomach/Bowel: No obstruction. No appendicitis. Moderate stool
volume.

Vascular/Lymphatic: No acute vascular abnormality. No mass or
adenopathy.

Peritoneal: Possible trace pelvic fluid which would be nonspecific
and potentially physiologic.

Musculoskeletal: Degenerative disc and facet disease, advanced for
age. Degenerative disc narrowing and endplate spurring is focally
advanced at L4-5. No acute finding.
IMPRESSION: 1. No acute finding, including appendicitis.
2. Moderate stool volume.

## 2016-05-18 ENCOUNTER — Other Ambulatory Visit: Payer: Self-pay | Admitting: Family Medicine

## 2016-09-18 ENCOUNTER — Ambulatory Visit (INDEPENDENT_AMBULATORY_CARE_PROVIDER_SITE_OTHER): Payer: 59 | Admitting: Family Medicine

## 2016-09-18 ENCOUNTER — Encounter: Payer: Self-pay | Admitting: Family Medicine

## 2016-09-18 VITALS — BP 140/88 | HR 63 | Temp 97.9°F | Wt 196.8 lb

## 2016-09-18 DIAGNOSIS — Z01419 Encounter for gynecological examination (general) (routine) without abnormal findings: Secondary | ICD-10-CM

## 2016-09-18 DIAGNOSIS — J069 Acute upper respiratory infection, unspecified: Secondary | ICD-10-CM | POA: Diagnosis not present

## 2016-09-18 DIAGNOSIS — R059 Cough, unspecified: Secondary | ICD-10-CM | POA: Insufficient documentation

## 2016-09-18 DIAGNOSIS — R05 Cough: Secondary | ICD-10-CM | POA: Diagnosis not present

## 2016-09-18 DIAGNOSIS — Z23 Encounter for immunization: Secondary | ICD-10-CM | POA: Diagnosis not present

## 2016-09-18 LAB — CBC WITH DIFFERENTIAL/PLATELET
BASOS PCT: 0.5 % (ref 0.0–3.0)
Basophils Absolute: 0 10*3/uL (ref 0.0–0.1)
EOS ABS: 0.2 10*3/uL (ref 0.0–0.7)
Eosinophils Relative: 2 % (ref 0.0–5.0)
HEMATOCRIT: 37.5 % (ref 36.0–46.0)
HEMOGLOBIN: 12.7 g/dL (ref 12.0–15.0)
LYMPHS PCT: 28.9 % (ref 12.0–46.0)
Lymphs Abs: 2.2 10*3/uL (ref 0.7–4.0)
MCHC: 33.9 g/dL (ref 30.0–36.0)
MCV: 90.5 fl (ref 78.0–100.0)
Monocytes Absolute: 0.6 10*3/uL (ref 0.1–1.0)
Monocytes Relative: 8.2 % (ref 3.0–12.0)
Neutro Abs: 4.7 10*3/uL (ref 1.4–7.7)
Neutrophils Relative %: 60.4 % (ref 43.0–77.0)
Platelets: 370 10*3/uL (ref 150.0–400.0)
RBC: 4.14 Mil/uL (ref 3.87–5.11)
RDW: 13.8 % (ref 11.5–15.5)
WBC: 7.8 10*3/uL (ref 4.0–10.5)

## 2016-09-18 LAB — TSH: TSH: 1.67 u[IU]/mL (ref 0.35–4.50)

## 2016-09-18 LAB — LIPID PANEL
Cholesterol: 203 mg/dL — ABNORMAL HIGH (ref 0–200)
HDL: 67.5 mg/dL (ref >39.00)
LDL Cholesterol: 127 mg/dL — ABNORMAL HIGH (ref 0–99)
NonHDL: 135.8
TRIGLYCERIDES: 44 mg/dL (ref 0.0–149.0)
Total CHOL/HDL Ratio: 3
VLDL: 8.8 mg/dL (ref 0.0–40.0)

## 2016-09-18 LAB — COMPREHENSIVE METABOLIC PANEL
ALBUMIN: 4.3 g/dL (ref 3.5–5.2)
ALK PHOS: 37 U/L — AB (ref 39–117)
ALT: 14 U/L (ref 0–35)
AST: 16 U/L (ref 0–37)
BILIRUBIN TOTAL: 0.5 mg/dL (ref 0.2–1.2)
BUN: 14 mg/dL (ref 6–23)
CALCIUM: 9.1 mg/dL (ref 8.4–10.5)
CHLORIDE: 105 meq/L (ref 96–112)
CO2: 26 mEq/L (ref 19–32)
CREATININE: 0.78 mg/dL (ref 0.40–1.20)
GFR: 82.92 mL/min (ref >60.00)
Glucose, Bld: 97 mg/dL (ref 70–99)
Potassium: 4.4 mEq/L (ref 3.5–5.1)
Sodium: 138 mEq/L (ref 135–145)
TOTAL PROTEIN: 7 g/dL (ref 6.0–8.3)

## 2016-09-18 MED ORDER — LEVOCETIRIZINE DIHYDROCHLORIDE 5 MG PO TABS: 5.0000 mg | ORAL_TABLET | Freq: Every evening | ORAL | 3 refills | Status: DC

## 2016-09-18 NOTE — Progress Notes (Signed)
SUBJECTIVE:  Kelsey Harrington is a 50 y.o. female who complains of coryza, congestion, sneezing, post nasal drip and dry cough for 2 months. She denies a history of anorexia and chest pain and denies a history of asthma. Patient denies smoke cigarettes.   No current outpatient prescriptions on file prior to visit.   No current facility-administered medications on file prior to visit.     No Known Allergies  Past Medical History:  Diagnosis Date  . Headache(784.0)     No past surgical history on file.  Family History  Problem Relation Age of Onset  . Arthritis Mother   . Hypertension Mother   . Hypertension Maternal Grandmother     Social History   Social History  . Marital status: Married    Spouse name: N/A  . Number of children: N/A  . Years of education: N/A   Occupational History  . Not on file.   Social History Main Topics  . Smoking status: Never Smoker  . Smokeless tobacco: Never Used  . Alcohol use Yes     Comment: occassionally  . Drug use: No  . Sexual activity: Not on file   Other Topics Concern  . Not on file   Social History Narrative  . No narrative on file   The PMH, PSH, Social History, Family History, Medications, and allergies have been reviewed in [facility], and have been updated if relevant.  OBJECTIVE: BP 140/88   Pulse 63   Temp 97.9 F (36.6 C) (Oral)   Wt 196 lb 12 oz (89.2 kg)   LMP 09/14/2016   SpO2 99%   BMI 30.82 kg/m   She appears well, vital signs are as noted. Ears normal.  Throat and pharynx normal.  Neck supple. No adenopathy in the neck. Nose is congested. Sinuses non tender. The chest is clear, without wheezes or rales.  ASSESSMENT:  allergic rhinitis  PLAN: eRx sent for xyzal 5 mg qhs. Symptomatic therapy suggested: push fluids, rest and return office visit prn if symptoms persist or worsen. Lack of antibiotic effectiveness discussed with her. Call or return to clinic prn if these symptoms worsen or fail to improve as  anticipated.

## 2016-09-18 NOTE — Patient Instructions (Addendum)
Great to see you.  Let's start xyzal 5 mg at bedtime.  Please keep me updated.  Happy Holidays!

## 2016-09-18 NOTE — Progress Notes (Signed)
Pre visit review using our clinic review tool, if applicable. No additional management support is needed unless otherwise documented below in the visit note. 

## 2016-10-16 ENCOUNTER — Ambulatory Visit (INDEPENDENT_AMBULATORY_CARE_PROVIDER_SITE_OTHER): Payer: 59 | Admitting: Family Medicine

## 2016-10-16 ENCOUNTER — Other Ambulatory Visit (HOSPITAL_COMMUNITY)
Admission: RE | Admit: 2016-10-16 | Discharge: 2016-10-16 | Disposition: A | Discharge status: Home / Self Care | Payer: 59 | Source: Ambulatory Visit | Attending: Family Medicine | Admitting: Family Medicine

## 2016-10-16 ENCOUNTER — Encounter: Payer: Self-pay | Admitting: Gastroenterology

## 2016-10-16 ENCOUNTER — Encounter: Payer: Self-pay | Admitting: Family Medicine

## 2016-10-16 VITALS — BP 114/62 | HR 75 | Temp 98.2°F | Ht 67.0 in | Wt 197.5 lb

## 2016-10-16 DIAGNOSIS — Z01419 Encounter for gynecological examination (general) (routine) without abnormal findings: Secondary | ICD-10-CM | POA: Diagnosis not present

## 2016-10-16 DIAGNOSIS — Z1151 Encounter for screening for human papillomavirus (HPV): Secondary | ICD-10-CM | POA: Insufficient documentation

## 2016-10-16 DIAGNOSIS — J309 Allergic rhinitis, unspecified: Secondary | ICD-10-CM

## 2016-10-16 DIAGNOSIS — R059 Cough, unspecified: Secondary | ICD-10-CM

## 2016-10-16 DIAGNOSIS — Z1211 Encounter for screening for malignant neoplasm of colon: Secondary | ICD-10-CM

## 2016-10-16 DIAGNOSIS — R05 Cough: Secondary | ICD-10-CM | POA: Diagnosis not present

## 2016-10-16 NOTE — Assessment & Plan Note (Signed)
D/c xyzal causing dizziness. Advised Zyrtec.

## 2016-10-16 NOTE — Patient Instructions (Addendum)
Great to see you.  Happy New Year!   We will call you with an appointment for you colonoscopy.  Try Zyrtec for the cough and itching. Please keep me updated.

## 2016-10-16 NOTE — Addendum Note (Signed)
Addended by: Modena Nunnery on: 10/16/2016 09:34 AM   Modules accepted: Orders

## 2016-10-16 NOTE — Progress Notes (Signed)
Subjective:   Patient ID: Kelsey Harrington, female    DOB: [birth year], 51 y.o.   MRN: [id number]  Kelsey Harrington is a pleasant 51 y.o. year old female who presents to clinic today with Annual Exam (with pap) and Dizziness (since starting xyzal)  on 10/16/2016  HPI:  Last pap smear done by me on 11/03/13. Normal. Mammogram 11/07/13 Has never had a colonoscopy.  Still having regular periods.  Xyzal helped with cough but it is making her dizzy.  Lab Results  Component Value Date   CHOL 203 (H) 09/18/2016   HDL 67.50 09/18/2016   LDLCALC 127 (H) 09/18/2016   LDLDIRECT 140.0 06/16/2011   TRIG 44.0 09/18/2016   CHOLHDL 3 09/18/2016   Lab Results  Component Value Date   CREATININE 0.78 09/18/2016   Lab Results  Component Value Date   NA 138 09/18/2016   K 4.4 09/18/2016   CL 105 09/18/2016   CO2 26 09/18/2016   Lab Results  Component Value Date   TSH 1.67 09/18/2016     Review of Systems  Constitutional: Negative.   HENT: Negative.   Eyes: Negative.   Respiratory: Negative.   Cardiovascular: Negative.   Gastrointestinal: Negative.   Endocrine: Negative.   Genitourinary: Negative.   Musculoskeletal: Negative.   Skin: Negative.   Allergic/Immunologic: Negative.   Neurological: Positive for dizziness.  Hematological: Negative.   Psychiatric/Behavioral: Negative.   All other systems reviewed and are negative.      Objective:    BP 114/62   Pulse 75   Temp 98.2 F (36.8 C) (Oral)   Ht 5\' 7"  (1.702 m)   Wt 197 lb 8 oz (89.6 kg)   LMP 10/09/2016   SpO2 100%   BMI 30.93 kg/m    Physical Exam   General:  Well-developed,well-nourished,in no acute distress; alert,appropriate and cooperative throughout examination Head:  normocephalic and atraumatic.   Eyes:  vision grossly intact, PERRL Ears:  R ear normal and L ear normal externally, TMs clear bilaterally Nose:  no external deformity.   Mouth:  good dentition.   Neck:  No deformities, masses, or tenderness  noted. Breasts:  No mass, nodules, thickening, tenderness, bulging, retraction, inflamation, nipple discharge or skin changes noted.   Lungs:  Normal respiratory effort, chest expands symmetrically. Lungs are clear to auscultation, no crackles or wheezes. Heart:  Normal rate and regular rhythm. S1 and S2 normal without gallop, murmur, click, rub or other extra sounds. Abdomen:  Bowel sounds positive,abdomen soft and non-tender without masses, organomegaly or hernias noted. Rectal:  no external abnormalities.   Genitalia:  Pelvic Exam:        External: normal female genitalia without lesions or masses        Vagina: normal without lesions or masses        Cervix: normal without lesions or masses        Adnexa: normal bimanual exam without masses or fullness        Uterus: normal by palpation        Pap smear: performed Msk:  No deformity or scoliosis noted of thoracic or lumbar spine.   Extremities:  No clubbing, cyanosis, edema, or deformity noted with normal full range of motion of all joints.   Neurologic:  alert & oriented X3 and gait normal.   Skin:  Intact without suspicious lesions or rashes Cervical Nodes:  No lymphadenopathy noted Axillary Nodes:  No palpable lymphadenopathy Psych:  Cognition and judgment appear intact. Alert and cooperative with  normal attention span and concentration. No apparent delusions, illusions, hallucinations       Assessment & Plan:   Well woman exam  Allergic rhinitis, unspecified chronicity, unspecified seasonality, unspecified trigger  Cough  Special screening for malignant neoplasms, colon - Plan: Ambulatory referral to Gastroenterology No Follow-up on file.

## 2016-10-16 NOTE — Assessment & Plan Note (Signed)
Reviewed preventive care protocols, scheduled due services, and updated immunizations Discussed nutrition, exercise, diet, and healthy lifestyle.  Referred for colonoscopy.  Pt to call to schedule mammogram.

## 2016-10-18 LAB — CYTOLOGY - PAP
Diagnosis: NEGATIVE
HPV: NOT DETECTED

## 2016-12-06 DIAGNOSIS — L300 Nummular dermatitis: Secondary | ICD-10-CM | POA: Diagnosis not present

## 2016-12-06 DIAGNOSIS — D229 Melanocytic nevi, unspecified: Secondary | ICD-10-CM | POA: Diagnosis not present

## 2016-12-06 DIAGNOSIS — L853 Xerosis cutis: Secondary | ICD-10-CM | POA: Diagnosis not present

## 2016-12-08 ENCOUNTER — Encounter: Payer: Self-pay | Admitting: Gastroenterology

## 2016-12-08 ENCOUNTER — Ambulatory Visit (AMBULATORY_SURGERY_CENTER): Payer: Self-pay

## 2016-12-08 VITALS — Ht 68.0 in | Wt 200.6 lb

## 2016-12-08 DIAGNOSIS — Z1211 Encounter for screening for malignant neoplasm of colon: Secondary | ICD-10-CM

## 2016-12-08 MED ORDER — NA SULFATE-K SULFATE-MG SULF 17.5-3.13-1.6 GM/177ML PO SOLN: 1.0000 | Freq: Once | ORAL | 0 refills | Status: AC

## 2016-12-08 NOTE — Progress Notes (Signed)
Denies allergies to eggs or soy products. Denies complication of anesthesia or sedation. Denies use of weight loss medication. Denies use of O2.   Emmi instructions given for colonoscopy.  

## 2016-12-11 DIAGNOSIS — Z1231 Encounter for screening mammogram for malignant neoplasm of breast: Secondary | ICD-10-CM | POA: Diagnosis not present

## 2016-12-22 ENCOUNTER — Encounter: Payer: 59 | Admitting: Gastroenterology

## 2017-01-11 DIAGNOSIS — L2089 Other atopic dermatitis: Secondary | ICD-10-CM | POA: Diagnosis not present

## 2017-01-11 DIAGNOSIS — L853 Xerosis cutis: Secondary | ICD-10-CM | POA: Diagnosis not present

## 2017-01-11 DIAGNOSIS — L299 Pruritus, unspecified: Secondary | ICD-10-CM | POA: Diagnosis not present

## 2017-12-18 DIAGNOSIS — Z23 Encounter for immunization: Secondary | ICD-10-CM | POA: Diagnosis not present

## 2018-03-06 ENCOUNTER — Encounter: Payer: Self-pay | Admitting: Family Medicine

## 2018-03-06 ENCOUNTER — Ambulatory Visit (INDEPENDENT_AMBULATORY_CARE_PROVIDER_SITE_OTHER): Payer: 59 | Admitting: Family Medicine

## 2018-03-06 ENCOUNTER — Ambulatory Visit (INDEPENDENT_AMBULATORY_CARE_PROVIDER_SITE_OTHER): Payer: 59

## 2018-03-06 VITALS — BP 116/86 | HR 63 | Temp 98.9°F | Ht 68.0 in | Wt 197.8 lb

## 2018-03-06 DIAGNOSIS — M79672 Pain in left foot: Secondary | ICD-10-CM

## 2018-03-06 DIAGNOSIS — M722 Plantar fascial fibromatosis: Secondary | ICD-10-CM | POA: Insufficient documentation

## 2018-03-06 DIAGNOSIS — Z1239 Encounter for other screening for malignant neoplasm of breast: Secondary | ICD-10-CM

## 2018-03-06 DIAGNOSIS — M7732 Calcaneal spur, left foot: Secondary | ICD-10-CM | POA: Diagnosis not present

## 2018-03-06 NOTE — Progress Notes (Signed)
Subjective:   Patient ID: Kelsey Harrington, female    DOB: Dec 09, 1965, 52 y.o.   MRN: [id number]  Kelsey Harrington is a pleasant 52 y.o. year old female who presents to clinic today with Foot Pain (Patient is here today C/O left foot pain.  Pain is bilateral daily but the left foot became severe last week in the heel and resting over the weekend did not help.  Pain is the same all the time and only relief she has is being off of it.  Denies any edema or erythema or injury.  She agrees to call [facility] if referral for mammogram is created.  She also agrees to [employer].)  on 03/06/2018  HPI:  Left heel pain- intermittent for months, getting worse over the past couple of weeks. Pain is worse when she has been on her feet all day.  First few steps of the day or after she has been seated are not the worst.  Changes shoes regularly.  On her feet most of the day at work.  No known injury.  No swelling or redness.  Current Outpatient Medications on File Prior to Visit  Medication Sig Dispense Refill  . triamcinolone cream (KENALOG) 0.1 % Apply 1 application topically 2 (two) times daily.     No current facility-administered medications on file prior to visit.     No Known Allergies  Past Medical History:  Diagnosis Date  . Eczema   . Headache(784.0)   . SVD (spontaneous vaginal delivery)     Past Surgical History:  Procedure Laterality Date  . DILATION AND CURETTAGE OF UTERUS    . nodule removed from neck Left     Family History  Problem Relation Age of Onset  . Arthritis Mother   . Hypertension Mother   . Hypertension Maternal Grandmother   . Colon cancer Neg Hx   . Esophageal cancer Neg Hx   . Rectal cancer Neg Hx   . Stomach cancer Neg Hx     Social History   Socioeconomic History  . Marital status: Married    Spouse name: Not on file  . Number of children: Not on file  . Years of education: Not on file  . Highest education level: Not on file  Occupational History   . Not on file  Social Needs  . Financial resource strain: Not on file  . Food insecurity:    Worry: Not on file    Inability: Not on file  . Transportation needs:    Medical: Not on file    Non-medical: Not on file  Tobacco Use  . Smoking status: Never Smoker  . Smokeless tobacco: Never Used  Substance and Sexual Activity  . Alcohol use: Yes    Comment: occassionally  . Drug use: No  . Sexual activity: Not on file  Lifestyle  . Physical activity:    Days per week: Not on file    Minutes per session: Not on file  . Stress: Not on file  Relationships  . Social connections:    Talks on phone: Not on file    Gets together: Not on file    Attends religious service: Not on file    Active member of club or organization: Not on file    Attends meetings of clubs or organizations: Not on file    Relationship status: Not on file  . Intimate partner violence:    Fear of current or ex partner: Not on file  Emotionally abused: Not on file    Physically abused: Not on file    Forced sexual activity: Not on file  Other Topics Concern  . Not on file  Social History Narrative  . Not on file   The PMH, PSH, Social History, Family History, Medications, and allergies have been reviewed in [facility], and have been updated if relevant.    Review of Systems  Musculoskeletal: Positive for arthralgias.  All other systems reviewed and are negative.      Objective:    BP 116/86 (BP Location: Left Arm, Patient Position: Sitting, Cuff Size: Normal)   Pulse 63   Temp 98.9 F (37.2 C) (Oral)   Ht 5\' 8"  (1.727 m)   Wt 197 lb 12.8 oz (89.7 kg)   LMP 02/20/2018   SpO2 98%   BMI 30.08 kg/m    Physical Exam  Constitutional: She is oriented to person, place, and time. She appears well-developed and well-nourished. No distress.  HENT:  Head: Normocephalic and atraumatic.  Cardiovascular: Normal rate.  Pulmonary/Chest: Effort normal.  Musculoskeletal:  TTP over left heel, otherwise  unremarkable  Neurological: She is alert and oriented to person, place, and time. No cranial nerve deficit.  Skin: She is not diaphoretic.  Psychiatric: She has a normal mood and affect. Her behavior is normal. Judgment and thought content normal.  Nursing note and vitals reviewed.         Assessment & Plan:   Left foot pain No follow-ups on file.

## 2018-03-06 NOTE — Patient Instructions (Signed)
Great to see you.  Okay to schedule your mammogram.  I will call you with your xray results.   Heel Spur A heel spur is a bony growth that forms on the bottom of your heel bone (calcaneus). Heel spurs are common and do not always cause pain. However, heel spurs often cause inflammation in the strong band of tissue that runs underneath the bone of your foot (plantar fascia). When this happens, you may feel pain on the bottom of your foot, near your heel. What are the causes? The cause of heel spurs is not completely understood. They may be caused by pressure on the heel. Or, they may stem from the muscle attachments (tendons) near the spur pulling on the heel. What increases the risk? You may be at risk for a heel spur if you:  Are older than 40.  Are overweight.  Have wear and tear arthritis (osteoarthritis).  Have plantar fascia inflammation.  What are the signs or symptoms? Some people have heel spurs but no symptoms. If you do have symptoms, they may include:  Pain in the bottom of your heel.  Pain that is worse when you first get out of bed.  Pain that gets worse after walking or standing.  How is this diagnosed? Your health care provider may diagnose a heel spur based on your symptoms and a physical exam. You may also have an X-ray of your foot to check for a bony growth coming from the calcaneus. How is this treated? Treatment aims to relieve the pain from the heel spur. This may include:  Stretching exercises.  Losing weight.  Wearing specific shoes, inserts, or orthotics for comfort and support.  Wearing splints at night to properly position your feet.  Taking over-the-counter medicine to relieve pain.  Being treated with high-intensity sound waves to break up the heel spur (extracorporeal shock wave therapy).  Getting steroid injections in your heel to reduce swelling and ease pain.  Having surgery if your heel spur causes long-term (chronic) pain.  Follow  these instructions at home:  Take medicines only as directed by your health care provider.  Ask your health care provider if you should use ice or cold packs on the painful areas of your heel or foot.  Avoid activities that cause you pain until you recover or as directed by your health care provider.  Stretch before exercising or being physically active.  Wear supportive shoes that fit well as directed by your health care provider. You might need to buy new shoes. Wearing old shoes or shoes that do not fit correctly may not provide the support that you need.  Lose weight if your health care provider thinks you should. This can relieve pressure on your foot that may be causing pain and discomfort. Contact a health care provider if:  Your pain continues or gets worse. This information is not intended to replace advice given to you by your health care provider. Make sure you discuss any questions you have with your health care provider. Document Released: 11/01/2005 Document Revised: 03/02/2016 Document Reviewed: 11/26/2013 Elsevier Interactive Patient Education  [employer].

## 2018-03-06 NOTE — Assessment & Plan Note (Signed)
Persistent- does not seem consistent with plantar fascitis. ?heel spur. Xray today. The patient indicates understanding of these issues and agrees with the plan.

## 2018-03-08 ENCOUNTER — Ambulatory Visit: Payer: Self-pay

## 2018-03-08 ENCOUNTER — Ambulatory Visit (INDEPENDENT_AMBULATORY_CARE_PROVIDER_SITE_OTHER): Payer: 59 | Admitting: Family Medicine

## 2018-03-08 ENCOUNTER — Encounter: Payer: Self-pay | Admitting: Family Medicine

## 2018-03-08 VITALS — BP 133/62 | HR 63 | Ht 68.0 in | Wt 187.0 lb

## 2018-03-08 DIAGNOSIS — M722 Plantar fascial fibromatosis: Secondary | ICD-10-CM

## 2018-03-08 DIAGNOSIS — M79672 Pain in left foot: Principal | ICD-10-CM

## 2018-03-08 DIAGNOSIS — M79671 Pain in right foot: Secondary | ICD-10-CM

## 2018-03-08 MED ORDER — NAPROXEN-ESOMEPRAZOLE 500-20 MG PO TBEC: 1.0000 | DELAYED_RELEASE_TABLET | Freq: Two times a day (BID) | ORAL | 3 refills | Status: DC

## 2018-03-08 NOTE — Progress Notes (Signed)
Kelsey Harrington - 52 y.o. female MRN [id number]  Date of birth: Nov 26, 1965  SUBJECTIVE:  Including CC & ROS.  Chief Complaint  Patient presents with  . Foot Pain    Kelsey Harrington is a 52 y.o. female that is presenting with bilateral foot pain. Left is worse than the right. Ongoing for several months, has been increasing over the past week. Pain is located on left heel on the plantar surface. Denies injury or prior surgeries. She stands 5-10 hours a day for her job.She has been applying ice. Pain is constant throbbing when she stands on it. Denies any inciting event.   Independent review of the left foot xray from 5/29 shows a plantar heel spur of the calcaneous    Review of Systems  Constitutional: Negative for fever.  HENT: Negative for congestion.   Respiratory: Negative for cough.   Cardiovascular: Negative for chest pain.  Gastrointestinal: Negative for abdominal pain.  Musculoskeletal: Negative for joint swelling.  Skin: Negative for color change.  Neurological: Negative for weakness.  Hematological: Negative for adenopathy.  Psychiatric/Behavioral: Negative for agitation.    HISTORY: Past Medical, Surgical, Social, and Family History Reviewed & Updated per EMR.   Pertinent Historical Findings include:  Past Medical History:  Diagnosis Date  . Eczema   . Headache(784.0)   . SVD (spontaneous vaginal delivery)     Past Surgical History:  Procedure Laterality Date  . DILATION AND CURETTAGE OF UTERUS    . nodule removed from neck Left     No Known Allergies  Family History  Problem Relation Age of Onset  . Arthritis Mother   . Hypertension Mother   . Hypertension Maternal Grandmother   . Colon cancer Neg Hx   . Esophageal cancer Neg Hx   . Rectal cancer Neg Hx   . Stomach cancer Neg Hx      Social History   Socioeconomic History  . Marital status: Married    Spouse name: Not on file  . Number of children: Not on file  . Years of education: Not on file  .  Highest education level: Not on file  Occupational History  . Not on file  Social Needs  . Financial resource strain: Not on file  . Food insecurity:    Worry: Not on file    Inability: Not on file  . Transportation needs:    Medical: Not on file    Non-medical: Not on file  Tobacco Use  . Smoking status: Never Smoker  . Smokeless tobacco: Never Used  Substance and Sexual Activity  . Alcohol use: Yes    Comment: occassionally  . Drug use: No  . Sexual activity: Not on file  Lifestyle  . Physical activity:    Days per week: Not on file    Minutes per session: Not on file  . Stress: Not on file  Relationships  . Social connections:    Talks on phone: Not on file    Gets together: Not on file    Attends religious service: Not on file    Active member of club or organization: Not on file    Attends meetings of clubs or organizations: Not on file    Relationship status: Not on file  . Intimate partner violence:    Fear of current or ex partner: Not on file    Emotionally abused: Not on file    Physically abused: Not on file    Forced sexual activity: Not on file  Other  Topics Concern  . Not on file  Social History Narrative  . Not on file     PHYSICAL EXAM:  VS: BP 133/62 (BP Location: Left Arm, Patient Position: Sitting, Cuff Size: Normal)   Pulse 63   Ht 5\' 8"  (1.727 m)   Wt 187 lb (84.8 kg)   LMP 02/20/2018   SpO2 98%   BMI 28.43 kg/m  Physical Exam Gen: NAD, alert, cooperative with exam, well-appearing ENT: normal lips, normal nasal mucosa,  Eye: normal EOM, normal conjunctiva and lids CV:  no edema, +2 pedal pulses   Resp: no accessory muscle use, non-labored,  Skin: no rashes, no areas of induration  Neuro: normal tone, normal sensation to touch Psych:  normal insight, alert and oriented MSK:  Left foot:  No significant swelling  Normal ankle ROM  Normal strength to resistance  TTP of the plantar calcaneous at the origin of the plantar fascia    Neurovascularly intact    Aspiration/Injection Procedure Note Kelsey Harrington January 22, 1966  Procedure: Injection Indications: left heel pain   Procedure Details Consent: Risks of procedure as well as the alternatives and risks of each were explained to the (patient/caregiver).  Consent for procedure obtained. Time Out: Verified patient identification, verified procedure, site/side was marked, verified correct patient position, special equipment/implants available, medications/allergies/relevent history reviewed, required imaging and test results available.  Performed.  The area was cleaned with iodine and alcohol swabs.    The left plantar fascia origin was injected using 1 cc's of 40 mg/52mL  Kenalog and 2 cc's of 0.5% bupivicaine with a 25 1 1/2" needle.  Ultrasound was used. Images were obtained in Transverse and Long views showing the injection.    A sterile dressing was applied.  Patient did tolerate procedure well.    ASSESSMENT & PLAN:   Plantar fasciitis of left foot Pain most suggestive of plantar fasciitis. Does have a plantar spur - injection today  - counseled on HEP  - provided info about mid foot arch strap. Gel heel cushion and wool cushion  - if no improvement consider PT

## 2018-03-08 NOTE — Patient Instructions (Signed)
Nice to meet you  Please try the exercises  Please try the medication  Please follow up with me at [place] to have the orthotics made  Please try the gel heel cups or midfoot arch strap

## 2018-03-10 NOTE — Assessment & Plan Note (Signed)
Pain most suggestive of plantar fasciitis. Does have a plantar spur - injection today  - counseled on HEP  - provided info about mid foot arch strap. Gel heel cushion and wool cushion  - if no improvement consider PT

## 2018-03-19 ENCOUNTER — Ambulatory Visit (INDEPENDENT_AMBULATORY_CARE_PROVIDER_SITE_OTHER): Payer: 59 | Admitting: Family Medicine

## 2018-03-19 DIAGNOSIS — M722 Plantar fascial fibromatosis: Secondary | ICD-10-CM | POA: Diagnosis not present

## 2018-03-19 NOTE — Assessment & Plan Note (Signed)
Tends to wear dress shoes and slip ons   Patient was fitted for a suede orthotic.  The orthotic was heated and afterward the patient stood on the orthotic blank positioned on the orthotic stand. The patient was positioned in subtalar neutral position and 10 degrees of ankle dorsiflexion in a weight bearing stance. After completion of molding, a stable base was applied to the orthotic blank. The blank was ground to a stable position for weight bearing. Size: 35F Base: Blue EVA Additional Posting and Padding: None The patient ambulated these, and they were very comfortable.

## 2018-03-19 NOTE — Progress Notes (Signed)
Jarah Pember - 52 y.o. female MRN [id number]  Date of birth: 29-Apr-1966  SUBJECTIVE:  Including CC & ROS.  Chief Complaint  Patient presents with  . Foot Orthotics    Windie Marasco is a 52 y.o. female that is here today for orthotics. No changes in her history.     Review of Systems  HISTORY: Past Medical, Surgical, Social, and Family History Reviewed & Updated per EMR.   Pertinent Historical Findings include:  Past Medical History:  Diagnosis Date  . Eczema   . Headache(784.0)   . SVD (spontaneous vaginal delivery)     Past Surgical History:  Procedure Laterality Date  . DILATION AND CURETTAGE OF UTERUS    . nodule removed from neck Left     No Known Allergies  Family History  Problem Relation Age of Onset  . Arthritis Mother   . Hypertension Mother   . Hypertension Maternal Grandmother   . Colon cancer Neg Hx   . Esophageal cancer Neg Hx   . Rectal cancer Neg Hx   . Stomach cancer Neg Hx      Social History   Socioeconomic History  . Marital status: Married    Spouse name: Not on file  . Number of children: Not on file  . Years of education: Not on file  . Highest education level: Not on file  Occupational History  . Not on file  Social Needs  . Financial resource strain: Not on file  . Food insecurity:    Worry: Not on file    Inability: Not on file  . Transportation needs:    Medical: Not on file    Non-medical: Not on file  Tobacco Use  . Smoking status: Never Smoker  . Smokeless tobacco: Never Used  Substance and Sexual Activity  . Alcohol use: Yes    Comment: occassionally  . Drug use: No  . Sexual activity: Not on file  Lifestyle  . Physical activity:    Days per week: Not on file    Minutes per session: Not on file  . Stress: Not on file  Relationships  . Social connections:    Talks on phone: Not on file    Gets together: Not on file    Attends religious service: Not on file    Active member of club or organization: Not on file    Attends meetings of clubs or organizations: Not on file    Relationship status: Not on file  . Intimate partner violence:    Fear of current or ex partner: Not on file    Emotionally abused: Not on file    Physically abused: Not on file    Forced sexual activity: Not on file  Other Topics Concern  . Not on file  Social History Narrative  . Not on file     PHYSICAL EXAM:  VS: LMP 02/20/2018  Physical Exam Gen: NAD, alert, cooperative with exam, well-appearing      ASSESSMENT & PLAN:   Plantar fasciitis of left foot Tends to wear dress shoes and slip ons   Patient was fitted for a suede orthotic.  The orthotic was heated and afterward the patient stood on the orthotic blank positioned on the orthotic stand. The patient was positioned in subtalar neutral position and 10 degrees of ankle dorsiflexion in a weight bearing stance. After completion of molding, a stable base was applied to the orthotic blank. The blank was ground to a stable position for weight bearing. Size:  7F Base: [facility] EVA Additional Posting and Padding: None The patient ambulated these, and they were very comfortable.

## 2018-04-03 ENCOUNTER — Encounter: Payer: Self-pay | Admitting: Family Medicine

## 2018-04-03 ENCOUNTER — Ambulatory Visit (INDEPENDENT_AMBULATORY_CARE_PROVIDER_SITE_OTHER): Payer: 59 | Admitting: Family Medicine

## 2018-04-03 VITALS — BP 114/80 | HR 75 | Temp 98.2°F | Ht 67.13 in | Wt 194.0 lb

## 2018-04-03 DIAGNOSIS — Z0000 Encounter for general adult medical examination without abnormal findings: Secondary | ICD-10-CM | POA: Diagnosis not present

## 2018-04-03 LAB — COMPREHENSIVE METABOLIC PANEL
ALBUMIN: 4.4 g/dL (ref 3.5–5.2)
ALT: 13 U/L (ref 0–35)
AST: 16 U/L (ref 0–37)
Alkaline Phosphatase: 42 U/L (ref 39–117)
BILIRUBIN TOTAL: 0.7 mg/dL (ref 0.2–1.2)
BUN: 15 mg/dL (ref 6–23)
CALCIUM: 9.6 mg/dL (ref 8.4–10.5)
CO2: 28 mEq/L (ref 19–32)
CREATININE: 0.89 mg/dL (ref 0.40–1.20)
Chloride: 103 mEq/L (ref 96–112)
GFR: 70.78 mL/min (ref >60.00)
Glucose, Bld: 103 mg/dL — ABNORMAL HIGH (ref 70–99)
Potassium: 4.2 mEq/L (ref 3.5–5.1)
Sodium: 137 mEq/L (ref 135–145)
Total Protein: 7.3 g/dL (ref 6.0–8.3)

## 2018-04-03 LAB — CBC WITH DIFFERENTIAL/PLATELET
BASOS PCT: 0.9 % (ref 0.0–3.0)
Basophils Absolute: 0.1 10*3/uL (ref 0.0–0.1)
EOS PCT: 2.8 % (ref 0.0–5.0)
Eosinophils Absolute: 0.2 10*3/uL (ref 0.0–0.7)
HCT: 41.2 % (ref 36.0–46.0)
HEMOGLOBIN: 13.9 g/dL (ref 12.0–15.0)
Lymphocytes Relative: 27.4 % (ref 12.0–46.0)
Lymphs Abs: 2 10*3/uL (ref 0.7–4.0)
MCHC: 33.8 g/dL (ref 30.0–36.0)
MCV: 91.9 fl (ref 78.0–100.0)
Monocytes Absolute: 0.6 10*3/uL (ref 0.1–1.0)
Monocytes Relative: 8.2 % (ref 3.0–12.0)
NEUTROS ABS: 4.5 10*3/uL (ref 1.4–7.7)
Neutrophils Relative %: 60.7 % (ref 43.0–77.0)
PLATELETS: 343 10*3/uL (ref 150.0–400.0)
RBC: 4.49 Mil/uL (ref 3.87–5.11)
RDW: 13.5 % (ref 11.5–15.5)
WBC: 7.5 10*3/uL (ref 4.0–10.5)

## 2018-04-03 LAB — LIPID PANEL
CHOLESTEROL: 220 mg/dL — AB (ref 0–200)
HDL: 64.9 mg/dL (ref >39.00)
LDL Cholesterol: 141 mg/dL — ABNORMAL HIGH (ref 0–99)
NonHDL: 155.19
TRIGLYCERIDES: 73 mg/dL (ref 0.0–149.0)
Total CHOL/HDL Ratio: 3
VLDL: 14.6 mg/dL (ref 0.0–40.0)

## 2018-04-03 LAB — TSH: TSH: 1.98 u[IU]/mL (ref 0.35–4.50)

## 2018-04-03 NOTE — Assessment & Plan Note (Signed)
Reviewed preventive care protocols, scheduled due services, and updated immunizations Discussed nutrition, exercise, diet, and healthy lifestyle.  Orders Placed This Encounter  Procedures  . CBC with Differential/Platelet  . Comprehensive metabolic panel  . Lipid panel  . TSH     

## 2018-04-03 NOTE — Patient Instructions (Signed)
Great to see you. Happy birthday!  I will call you with your lab results from today and you can view them online.

## 2018-04-03 NOTE — Progress Notes (Signed)
Subjective:   Patient ID: Kelsey Harrington, female    DOB: 09/15/1966, 52 y.o.   MRN: [id number]  Kelsey Harrington is a pleasant 52 y.o. year old female who presents to clinic today with Annual Exam (Patient is here today for a CPE without PAP.  Pap completed 1.8.18 WNL.  Mammogram was ordered on 5.29.19 and states that she has an appt scheduled.  She is currently fasting. She sent her Cologuard back in to the company so we should be receiving those results soon.)  on 04/03/2018  HPI:  Health Maintenance  Topic Date Due  . MAMMOGRAM  11/07/2014  . COLONOSCOPY  03/21/2016  . HIV Screening  04/04/2019 (Originally 03/21/1981)  . INFLUENZA VACCINE  05/09/2018  . PAP SMEAR  10/17/2019  . TETANUS/TDAP  12/19/2027    Last pap smear done by me on 10/16/16 Normal. Mammogram ordered on 03/06/18- she has appt scheduled. Has never had a colonoscopy but she has done cologuard- awaiting results.  Still having regular periods.    Lab Results  Component Value Date   CHOL 203 (H) 09/18/2016   HDL 67.50 09/18/2016   LDLCALC 127 (H) 09/18/2016   LDLDIRECT 140.0 06/16/2011   TRIG 44.0 09/18/2016   CHOLHDL 3 09/18/2016   Lab Results  Component Value Date   CREATININE 0.78 09/18/2016   Lab Results  Component Value Date   NA 138 09/18/2016   K 4.4 09/18/2016   CL 105 09/18/2016   CO2 26 09/18/2016   Lab Results  Component Value Date   TSH 1.67 09/18/2016     Review of Systems  Constitutional: Negative.   HENT: Negative.   Eyes: Negative.   Respiratory: Negative.   Cardiovascular: Negative.   Gastrointestinal: Negative.   Endocrine: Negative.   Genitourinary: Negative.   Musculoskeletal: Negative.   Skin: Negative.   Allergic/Immunologic: Negative.   Neurological: Negative for dizziness.  Hematological: Negative.   Psychiatric/Behavioral: Negative.   All other systems reviewed and are negative.      Objective:    BP 114/80 (BP Location: Left Arm, Patient Position: Sitting, Cuff  Size: Normal)   Pulse 75   Temp 98.2 F (36.8 C) (Oral)   Ht 5' 7.13" (1.705 m)   Wt 194 lb (88 kg)   LMP 03/03/2018   SpO2 97%   BMI 30.27 kg/m   Wt Readings from Last 3 Encounters:  04/03/18 194 lb (88 kg)  03/08/18 187 lb (84.8 kg)  03/06/18 197 lb 12.8 oz (89.7 kg)    Physical Exam   General:  Well-developed,well-nourished,in no acute distress; alert,appropriate and cooperative throughout examination Head:  normocephalic and atraumatic.   Eyes:  vision grossly intact, PERRL Ears:  R ear normal and L ear normal externally, TMs clear bilaterally Nose:  no external deformity.   Mouth:  good dentition.   Neck:  No deformities, masses, or tenderness noted. Breasts:  No mass, nodules, thickening, tenderness, bulging, retraction, inflamation, nipple discharge or skin changes noted.   Lungs:  Normal respiratory effort, chest expands symmetrically. Lungs are clear to auscultation, no crackles or wheezes. Heart:  Normal rate and regular rhythm. S1 and S2 normal without gallop, murmur, click, rub or other extra sounds. Abdomen:  Bowel sounds positive,abdomen soft and non-tender without masses, organomegaly or hernias noted. Msk:  No deformity or scoliosis noted of thoracic or lumbar spine.   Extremities:  No clubbing, cyanosis, edema, or deformity noted with normal full range of motion of all joints.   Neurologic:  alert &  oriented X3 and gait normal.   Skin:  Intact without suspicious lesions or rashes Cervical Nodes:  No lymphadenopathy noted Axillary Nodes:  No palpable lymphadenopathy Psych:  Cognition and judgment appear intact. Alert and cooperative with normal attention span and concentration. No apparent delusions, illusions, hallucinations      Assessment & Plan:   Well woman exam without gynecological exam No follow-ups on file.

## 2018-05-01 ENCOUNTER — Encounter: Payer: Self-pay | Admitting: Family Medicine

## 2018-05-01 DIAGNOSIS — Z1231 Encounter for screening mammogram for malignant neoplasm of breast: Secondary | ICD-10-CM | POA: Diagnosis not present

## 2018-05-08 ENCOUNTER — Encounter: Payer: Self-pay | Admitting: Family Medicine

## 2018-05-08 DIAGNOSIS — R928 Other abnormal and inconclusive findings on diagnostic imaging of breast: Secondary | ICD-10-CM | POA: Diagnosis not present

## 2018-10-30 IMAGING — DX DG FOOT COMPLETE 3+V*L*
3 series · 3 of 3 positions shown · non-contrast
Comparison: None.

CLINICAL DATA: Left foot pain no injury

EXAM:
LEFT FOOT - COMPLETE 3+ VIEW

[foot ap]
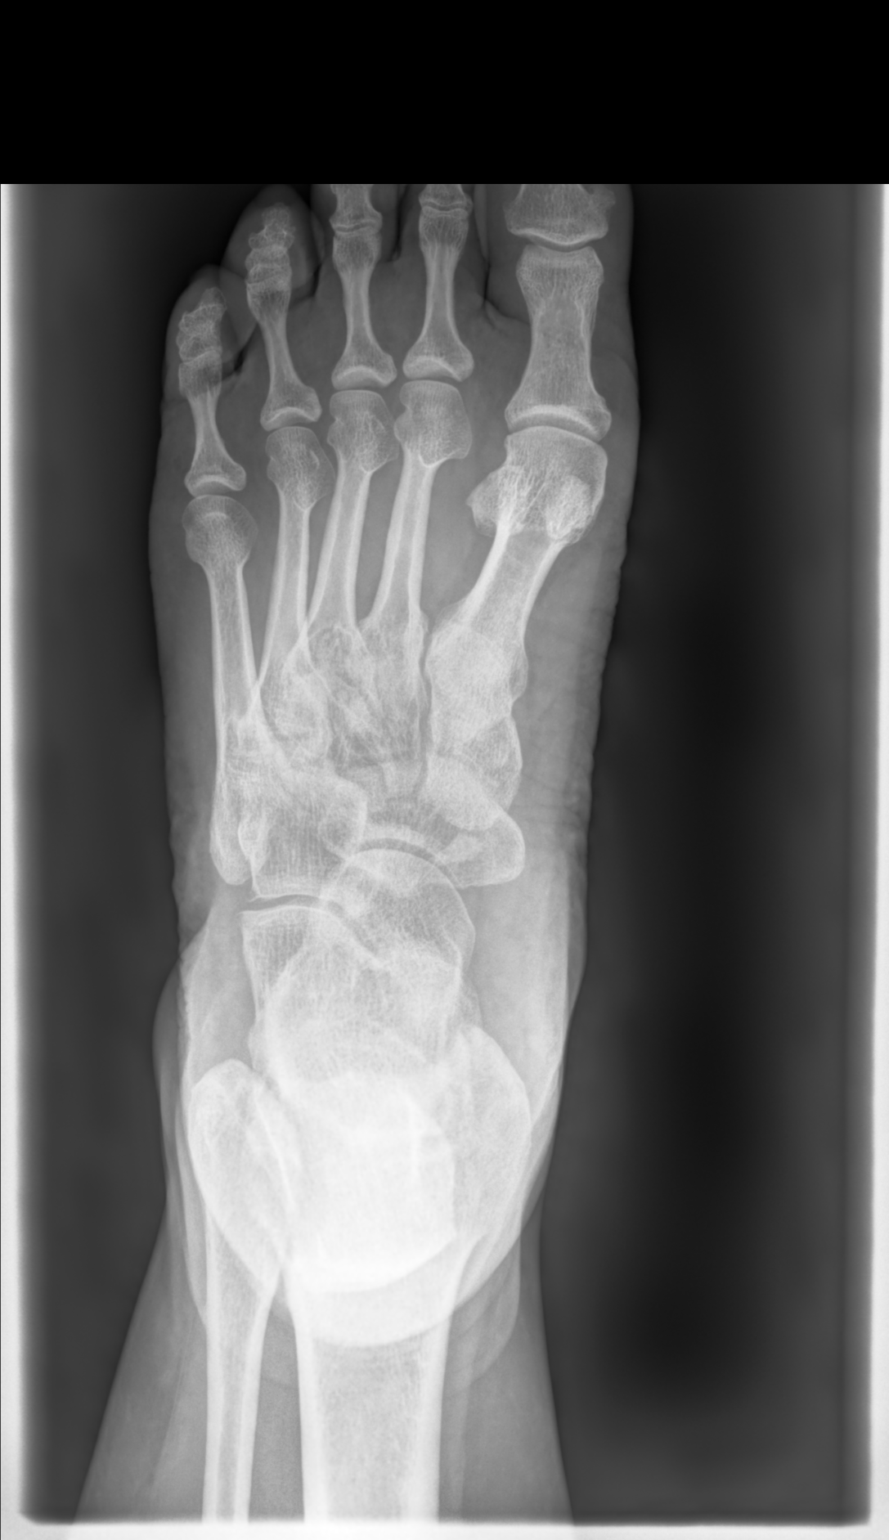

[foot mlo]
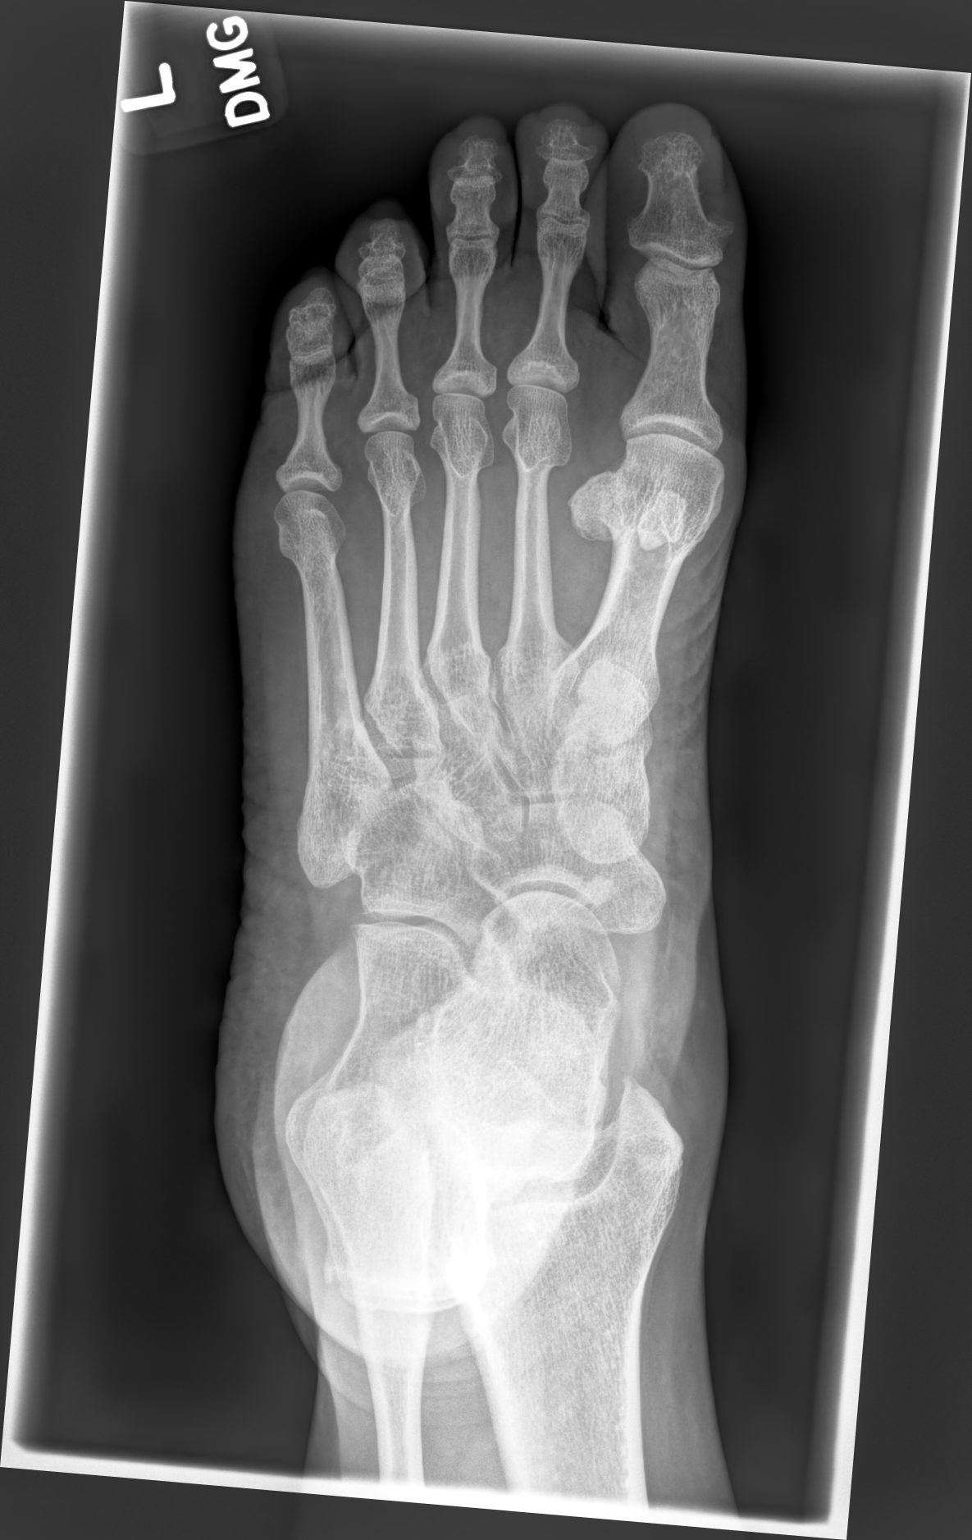

[foot lat]
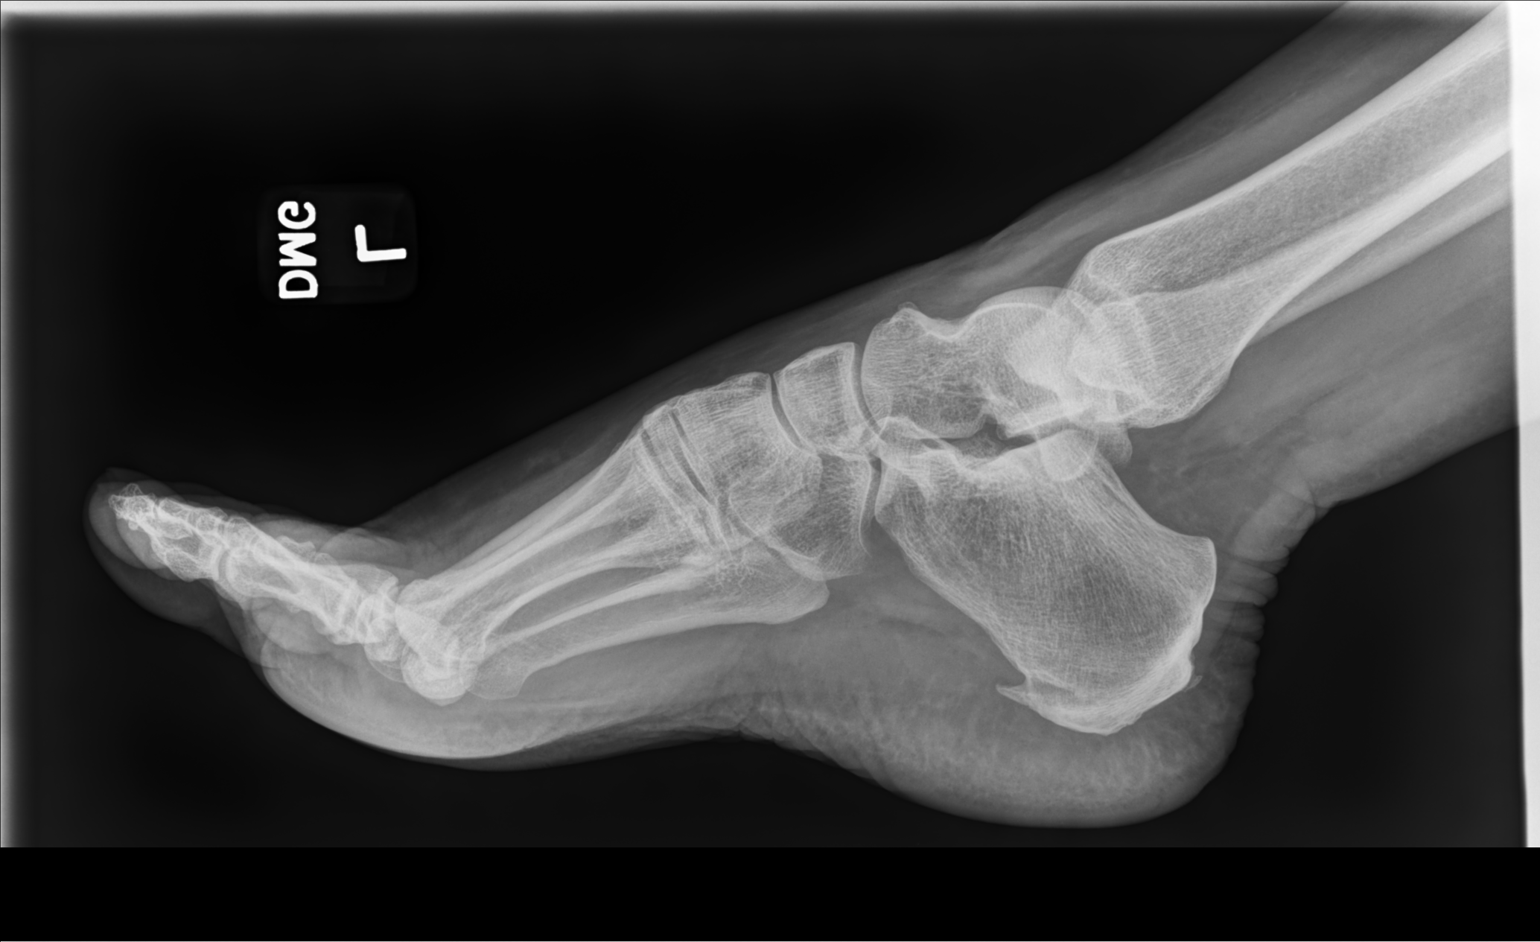

[3 of 3 positions shown; findings below may reference images not displayed]

FINDINGS: There is no evidence of fracture or dislocation. There is no
evidence of arthropathy or other focal bone abnormality. Soft
tissues are unremarkable. Calcaneal spurring plantar surface.
IMPRESSION: Calcaneal spurring
